# Patient Record
Sex: Male | Born: 1990 | Race: Black or African American | Hispanic: No | Marital: Single | Smoking: Former smoker
Health system: Southern US, Community
[De-identification: ages and names within clinical notes are randomized; demographics above are authoritative.]

## PROBLEM LIST (undated history)

## (undated) DIAGNOSIS — Z789 Other specified health status: Secondary | ICD-10-CM

## (undated) HISTORY — PX: NO PAST SURGERIES: SHX2092

---
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
## 2011-02-24 LAB — POCT I-STAT, CHEM 8
Calcium, Ion: 1.04 mmol/L — ABNORMAL LOW (ref 1.12–1.32)
Creatinine, Ser: 1.2 mg/dL (ref 0.4–1.5)
Glucose, Bld: 97 mg/dL (ref 70–99)
Potassium: 3.3 meq/L — ABNORMAL LOW (ref 3.5–5.1)
Sodium: 143 meq/L (ref 135–145)
TCO2: 23 mmol/L (ref 0–100)

## 2011-02-24 LAB — RAPID URINE DRUG SCREEN, HOSP PERFORMED
Amphetamines: NOT DETECTED
Opiates: NOT DETECTED

## 2011-02-24 LAB — CBC
Hemoglobin: 16.5 g/dL (ref 13.0–17.0)
MCH: 29.3 pg (ref 26.0–34.0)
RBC: 5.65 MIL/uL (ref 4.22–5.81)

## 2011-02-24 LAB — URINALYSIS, ROUTINE W REFLEX MICROSCOPIC
Glucose, UA: NEGATIVE mg/dL
Nitrite: NEGATIVE

## 2011-02-24 LAB — COMPREHENSIVE METABOLIC PANEL
ALT: 25 U/L (ref 0–53)
AST: 40 U/L — ABNORMAL HIGH (ref 0–37)
Albumin: 4.4 g/dL (ref 3.5–5.2)
Alkaline Phosphatase: 76 U/L (ref 39–117)
CO2: 25 mEq/L (ref 19–32)
Chloride: 109 mEq/L (ref 96–112)
Creatinine, Ser: 1.11 mg/dL (ref 0.4–1.5)
Glucose, Bld: 105 mg/dL — ABNORMAL HIGH (ref 70–99)
Sodium: 143 mEq/L (ref 135–145)
Total Protein: 7.3 g/dL (ref 6.0–8.3)

## 2011-02-24 LAB — SAMPLE TO BLOOD BANK

## 2011-02-24 LAB — URINE MICROSCOPIC-ADD ON

## 2011-02-24 LAB — PROTIME-INR: Prothrombin Time: 13.8 seconds (ref 11.6–15.2)

## 2011-02-24 LAB — LACTIC ACID, PLASMA: Lactic Acid, Venous: 2.6 mmol/L — ABNORMAL HIGH (ref 0.5–2.2)

## 2011-09-13 ENCOUNTER — Ambulatory Visit (INDEPENDENT_AMBULATORY_CARE_PROVIDER_SITE_OTHER): Payer: Self-pay

## 2011-09-13 ENCOUNTER — Inpatient Hospital Stay (INDEPENDENT_AMBULATORY_CARE_PROVIDER_SITE_OTHER)
Admission: RE | Admit: 2011-09-13 | Discharge: 2011-09-13 | Disposition: A | Discharge status: Home / Self Care | Payer: Self-pay | Source: Ambulatory Visit | Attending: Emergency Medicine | Admitting: Emergency Medicine

## 2011-09-13 DIAGNOSIS — R0789 Other chest pain: Secondary | ICD-10-CM

## 2012-02-15 ENCOUNTER — Encounter (HOSPITAL_COMMUNITY): Payer: Self-pay | Admitting: *Deleted

## 2012-02-15 ENCOUNTER — Inpatient Hospital Stay (HOSPITAL_COMMUNITY)
Admission: EM | Admit: 2012-02-15 | Discharge: 2012-02-24 | DRG: 515 | Disposition: A | Discharge status: Home Health | Payer: Medicaid Other | Attending: General Surgery | Admitting: General Surgery

## 2012-02-15 ENCOUNTER — Emergency Department (HOSPITAL_COMMUNITY): Payer: Medicaid Other

## 2012-02-15 DIAGNOSIS — IMO0002 Reserved for concepts with insufficient information to code with codable children: Secondary | ICD-10-CM

## 2012-02-15 DIAGNOSIS — S32509A Unspecified fracture of unspecified pubis, initial encounter for closed fracture: Principal | ICD-10-CM | POA: Diagnosis present

## 2012-02-15 DIAGNOSIS — D62 Acute posthemorrhagic anemia: Secondary | ICD-10-CM | POA: Diagnosis not present

## 2012-02-15 DIAGNOSIS — J9819 Other pulmonary collapse: Secondary | ICD-10-CM | POA: Diagnosis not present

## 2012-02-15 DIAGNOSIS — S8991XA Unspecified injury of right lower leg, initial encounter: Secondary | ICD-10-CM | POA: Diagnosis present

## 2012-02-15 DIAGNOSIS — S40019A Contusion of unspecified shoulder, initial encounter: Secondary | ICD-10-CM | POA: Diagnosis present

## 2012-02-15 DIAGNOSIS — S8000XA Contusion of unspecified knee, initial encounter: Secondary | ICD-10-CM | POA: Diagnosis present

## 2012-02-15 DIAGNOSIS — S01502A Unspecified open wound of oral cavity, initial encounter: Secondary | ICD-10-CM | POA: Diagnosis present

## 2012-02-15 DIAGNOSIS — F17200 Nicotine dependence, unspecified, uncomplicated: Secondary | ICD-10-CM | POA: Diagnosis present

## 2012-02-15 DIAGNOSIS — S0100XA Unspecified open wound of scalp, initial encounter: Secondary | ICD-10-CM | POA: Diagnosis present

## 2012-02-15 DIAGNOSIS — S060X9A Concussion with loss of consciousness of unspecified duration, initial encounter: Secondary | ICD-10-CM | POA: Diagnosis present

## 2012-02-15 DIAGNOSIS — S01512A Laceration without foreign body of oral cavity, initial encounter: Secondary | ICD-10-CM | POA: Diagnosis present

## 2012-02-15 DIAGNOSIS — Z79899 Other long term (current) drug therapy: Secondary | ICD-10-CM

## 2012-02-15 DIAGNOSIS — S83509A Sprain of unspecified cruciate ligament of unspecified knee, initial encounter: Secondary | ICD-10-CM | POA: Diagnosis present

## 2012-02-15 DIAGNOSIS — S8992XA Unspecified injury of left lower leg, initial encounter: Secondary | ICD-10-CM | POA: Diagnosis present

## 2012-02-15 DIAGNOSIS — J15211 Pneumonia due to Methicillin susceptible Staphylococcus aureus: Secondary | ICD-10-CM | POA: Diagnosis not present

## 2012-02-15 DIAGNOSIS — S60419A Abrasion of unspecified finger, initial encounter: Secondary | ICD-10-CM | POA: Diagnosis present

## 2012-02-15 DIAGNOSIS — S60511A Abrasion of right hand, initial encounter: Secondary | ICD-10-CM

## 2012-02-15 DIAGNOSIS — S0101XA Laceration without foreign body of scalp, initial encounter: Secondary | ICD-10-CM

## 2012-02-15 DIAGNOSIS — S0081XA Abrasion of other part of head, initial encounter: Secondary | ICD-10-CM | POA: Diagnosis present

## 2012-02-15 DIAGNOSIS — Y9210 Unspecified residential institution as the place of occurrence of the external cause: Secondary | ICD-10-CM | POA: Diagnosis not present

## 2012-02-15 DIAGNOSIS — R911 Solitary pulmonary nodule: Secondary | ICD-10-CM | POA: Diagnosis present

## 2012-02-15 DIAGNOSIS — T17908A Unspecified foreign body in respiratory tract, part unspecified causing other injury, initial encounter: Secondary | ICD-10-CM | POA: Diagnosis not present

## 2012-02-15 DIAGNOSIS — Y92410 Unspecified street and highway as the place of occurrence of the external cause: Secondary | ICD-10-CM

## 2012-02-15 DIAGNOSIS — S32599A Other specified fracture of unspecified pubis, initial encounter for closed fracture: Secondary | ICD-10-CM | POA: Diagnosis present

## 2012-02-15 DIAGNOSIS — S022XXA Fracture of nasal bones, initial encounter for closed fracture: Secondary | ICD-10-CM

## 2012-02-15 DIAGNOSIS — S838X9A Sprain of other specified parts of unspecified knee, initial encounter: Secondary | ICD-10-CM | POA: Diagnosis present

## 2012-02-15 DIAGNOSIS — R918 Other nonspecific abnormal finding of lung field: Secondary | ICD-10-CM | POA: Diagnosis present

## 2012-02-15 DIAGNOSIS — S060X1A Concussion with loss of consciousness of 30 minutes or less, initial encounter: Secondary | ICD-10-CM | POA: Diagnosis present

## 2012-02-15 DIAGNOSIS — S0083XA Contusion of other part of head, initial encounter: Secondary | ICD-10-CM | POA: Diagnosis present

## 2012-02-15 DIAGNOSIS — F411 Generalized anxiety disorder: Secondary | ICD-10-CM | POA: Diagnosis not present

## 2012-02-15 DIAGNOSIS — S4992XA Unspecified injury of left shoulder and upper arm, initial encounter: Secondary | ICD-10-CM | POA: Diagnosis present

## 2012-02-15 DIAGNOSIS — S0003XA Contusion of scalp, initial encounter: Secondary | ICD-10-CM | POA: Diagnosis present

## 2012-02-15 DIAGNOSIS — J386 Stenosis of larynx: Secondary | ICD-10-CM | POA: Diagnosis not present

## 2012-02-15 DIAGNOSIS — S70212A Abrasion, left hip, initial encounter: Secondary | ICD-10-CM

## 2012-02-15 DIAGNOSIS — S329XXA Fracture of unspecified parts of lumbosacral spine and pelvis, initial encounter for closed fracture: Secondary | ICD-10-CM

## 2012-02-15 DIAGNOSIS — N501 Vascular disorders of male genital organs: Secondary | ICD-10-CM | POA: Diagnosis present

## 2012-02-15 DIAGNOSIS — S7000XA Contusion of unspecified hip, initial encounter: Secondary | ICD-10-CM | POA: Diagnosis present

## 2012-02-15 HISTORY — DX: Other specified health status: Z78.9

## 2012-02-15 LAB — CBC
Hemoglobin: 16.9 g/dL (ref 13.0–17.0)
Platelets: 232 10*3/uL (ref 150–400)
RBC: 5.54 MIL/uL (ref 4.22–5.81)
WBC: 20.5 10*3/uL — ABNORMAL HIGH (ref 4.0–10.5)

## 2012-02-15 LAB — PROTIME-INR
INR: 1.07 (ref 0.00–1.49)
Prothrombin Time: 14.1 seconds (ref 11.6–15.2)

## 2012-02-15 LAB — COMPREHENSIVE METABOLIC PANEL
AST: 112 U/L — ABNORMAL HIGH (ref 0–37)
BUN: 10 mg/dL (ref 6–23)
CO2: 21 mEq/L (ref 19–32)
Calcium: 9.2 mg/dL (ref 8.4–10.5)
Chloride: 104 mEq/L (ref 96–112)
Creatinine, Ser: 1.04 mg/dL (ref 0.50–1.35)
GFR calc Af Amer: >90 mL/min (ref >90)
GFR calc non Af Amer: >90 mL/min (ref >90)
Glucose, Bld: 105 mg/dL — ABNORMAL HIGH (ref 70–99)
Total Bilirubin: 0.4 mg/dL (ref 0.3–1.2)

## 2012-02-15 LAB — SAMPLE TO BLOOD BANK

## 2012-02-15 MED ORDER — TETANUS-DIPHTH-ACELL PERTUSSIS 5-2.5-18.5 LF-MCG/0.5 IM SUSP
0.5000 mL | Freq: Once | INTRAMUSCULAR | Status: AC
  Administered 2012-02-15: 0.5 mL via INTRAMUSCULAR
  Filled 2012-02-15: qty 0.5

## 2012-02-15 MED ORDER — FENTANYL CITRATE 0.05 MG/ML IJ SOLN
50.0000 ug | Freq: Once | INTRAMUSCULAR | Status: AC
  Administered 2012-02-15: 50 ug via INTRAVENOUS
  Filled 2012-02-15: qty 2

## 2012-02-15 MED ORDER — IOHEXOL 300 MG/ML  SOLN
100.0000 mL | Freq: Once | INTRAMUSCULAR | Status: AC | PRN
Start: 2012-02-15 — End: 2012-02-15
  Administered 2012-02-15: 100 mL via INTRAVENOUS

## 2012-02-15 MED ORDER — SODIUM CHLORIDE 0.9 % IV BOLUS (SEPSIS)
1000.0000 mL | Freq: Once | INTRAVENOUS | Status: AC
  Administered 2012-02-15: 1000 mL via INTRAVENOUS
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
## 2012-02-15 NOTE — ED Notes (Signed)
Pt returned to room.  CSI at bedside.  Will transfer pt to room 2 when complete.
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
## 2012-02-15 NOTE — ED Notes (Signed)
See trauma chart
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
## 2012-02-15 NOTE — ED Notes (Signed)
Pt resting.  No distress noted.  Non-labored respirations.

## 2012-02-15 NOTE — ED Notes (Signed)
Pt to CT scan.

## 2012-02-16 ENCOUNTER — Encounter (HOSPITAL_COMMUNITY): Admission: EM | Disposition: A | Discharge status: Home Health | Payer: Self-pay | Source: Home / Self Care

## 2012-02-16 ENCOUNTER — Encounter (HOSPITAL_COMMUNITY): Payer: Self-pay

## 2012-02-16 ENCOUNTER — Encounter (HOSPITAL_COMMUNITY): Payer: Self-pay | Admitting: *Deleted

## 2012-02-16 ENCOUNTER — Inpatient Hospital Stay (HOSPITAL_COMMUNITY): Payer: Medicaid Other

## 2012-02-16 ENCOUNTER — Observation Stay (HOSPITAL_COMMUNITY): Payer: Medicaid Other

## 2012-02-16 DIAGNOSIS — S32599A Other specified fracture of unspecified pubis, initial encounter for closed fracture: Secondary | ICD-10-CM | POA: Diagnosis present

## 2012-02-16 DIAGNOSIS — R918 Other nonspecific abnormal finding of lung field: Secondary | ICD-10-CM | POA: Diagnosis present

## 2012-02-16 DIAGNOSIS — S0003XA Contusion of scalp, initial encounter: Secondary | ICD-10-CM | POA: Diagnosis present

## 2012-02-16 DIAGNOSIS — S060X1A Concussion with loss of consciousness of 30 minutes or less, initial encounter: Secondary | ICD-10-CM | POA: Diagnosis present

## 2012-02-16 DIAGNOSIS — S022XXA Fracture of nasal bones, initial encounter for closed fracture: Secondary | ICD-10-CM | POA: Diagnosis present

## 2012-02-16 DIAGNOSIS — J386 Stenosis of larynx: Secondary | ICD-10-CM | POA: Diagnosis not present

## 2012-02-16 DIAGNOSIS — S0081XA Abrasion of other part of head, initial encounter: Secondary | ICD-10-CM | POA: Diagnosis present

## 2012-02-16 DIAGNOSIS — S4992XA Unspecified injury of left shoulder and upper arm, initial encounter: Secondary | ICD-10-CM | POA: Diagnosis present

## 2012-02-16 DIAGNOSIS — S01512A Laceration without foreign body of oral cavity, initial encounter: Secondary | ICD-10-CM | POA: Diagnosis present

## 2012-02-16 DIAGNOSIS — J9600 Acute respiratory failure, unspecified whether with hypoxia or hypercapnia: Secondary | ICD-10-CM

## 2012-02-16 DIAGNOSIS — J988 Other specified respiratory disorders: Secondary | ICD-10-CM

## 2012-02-16 DIAGNOSIS — S60419A Abrasion of unspecified finger, initial encounter: Secondary | ICD-10-CM | POA: Diagnosis present

## 2012-02-16 DIAGNOSIS — N501 Vascular disorders of male genital organs: Secondary | ICD-10-CM | POA: Diagnosis present

## 2012-02-16 DIAGNOSIS — S8992XA Unspecified injury of left lower leg, initial encounter: Secondary | ICD-10-CM | POA: Diagnosis present

## 2012-02-16 DIAGNOSIS — S8991XA Unspecified injury of right lower leg, initial encounter: Secondary | ICD-10-CM | POA: Diagnosis present

## 2012-02-16 LAB — URINALYSIS, MICROSCOPIC ONLY
Glucose, UA: NEGATIVE mg/dL
Ketones, ur: NEGATIVE mg/dL
Leukocytes, UA: NEGATIVE
Nitrite: NEGATIVE
pH: 6 (ref 5.0–8.0)

## 2012-02-16 LAB — CBC
HCT: 46.5 % (ref 39.0–52.0)
Hemoglobin: 15.4 g/dL (ref 13.0–17.0)
MCHC: 33.1 g/dL (ref 30.0–36.0)
MCV: 89.3 fL (ref 78.0–100.0)

## 2012-02-16 LAB — BASIC METABOLIC PANEL
BUN: 8 mg/dL (ref 6–23)
Chloride: 99 mEq/L (ref 96–112)
GFR calc non Af Amer: >90 mL/min (ref >90)
Glucose, Bld: 100 mg/dL — ABNORMAL HIGH (ref 70–99)
Potassium: 4 mEq/L (ref 3.5–5.1)

## 2012-02-16 LAB — MRSA PCR SCREENING: MRSA by PCR: NEGATIVE

## 2012-02-16 SURGERY — Surgical Case
Anesthesia: Monitor Anesthesia Care

## 2012-02-16 MED ORDER — SODIUM CHLORIDE 0.9 % IV SOLN
INTRAVENOUS | Status: DC
  Administered 2012-02-16 – 2012-02-17 (×5): via INTRAVENOUS
  Administered 2012-02-18: 100 mL/h via INTRAVENOUS
  Administered 2012-02-19: 03:00:00 via INTRAVENOUS
  Administered 2012-02-20 – 2012-02-21 (×2): 100 mL/h via INTRAVENOUS
  Administered 2012-02-21: 20 mL/h via INTRAVENOUS

## 2012-02-16 MED ORDER — ONDANSETRON HCL 4 MG PO TABS: 4.0000 mg | ORAL_TABLET | Freq: Four times a day (QID) | ORAL | Status: DC | PRN

## 2012-02-16 MED ORDER — LACTATED RINGERS IV SOLN
INTRAVENOUS | Status: DC | PRN
  Administered 2012-02-16: 12:00:00 via INTRAVENOUS

## 2012-02-16 MED ORDER — FENTANYL CITRATE 0.05 MG/ML IJ SOLN
INTRAMUSCULAR | Status: DC | PRN
  Administered 2012-02-16: 100 ug via INTRAVENOUS
  Administered 2012-02-16: 150 ug via INTRAVENOUS

## 2012-02-16 MED ORDER — PANTOPRAZOLE SODIUM 40 MG PO TBEC
40.0000 mg | DELAYED_RELEASE_TABLET | Freq: Every day | ORAL | Status: DC
  Administered 2012-02-21: 40 mg via ORAL
  Filled 2012-02-16: qty 1

## 2012-02-16 MED ORDER — MIDAZOLAM HCL 2 MG/2ML IJ SOLN
2.0000 mg | Freq: Once | INTRAMUSCULAR | Status: DC
  Filled 2012-02-16 (×3): qty 2

## 2012-02-16 MED ORDER — FENTANYL CITRATE 0.05 MG/ML IJ SOLN
50.0000 ug | INTRAMUSCULAR | Status: DC | PRN
  Administered 2012-02-16: 100 ug via INTRAVENOUS
  Administered 2012-02-16 (×2): 50 ug via INTRAVENOUS
  Administered 2012-02-17 – 2012-02-18 (×9): 100 ug via INTRAVENOUS
  Filled 2012-02-16 (×11): qty 2

## 2012-02-16 MED ORDER — PROPOFOL 10 MG/ML IV EMUL
INTRAVENOUS | Status: DC | PRN
  Administered 2012-02-16: 100 ug/kg/min via INTRAVENOUS
  Administered 2012-02-16: 1000 mg

## 2012-02-16 MED ORDER — CHLORHEXIDINE GLUCONATE 0.12 % MT SOLN
15.0000 mL | Freq: Two times a day (BID) | OROMUCOSAL | Status: DC
  Administered 2012-02-16 – 2012-02-24 (×15): 15 mL via OROMUCOSAL
  Filled 2012-02-16 (×16): qty 15

## 2012-02-16 MED ORDER — PANTOPRAZOLE SODIUM 40 MG IV SOLR
40.0000 mg | Freq: Every day | INTRAVENOUS | Status: DC
  Administered 2012-02-16 – 2012-02-20 (×5): 40 mg via INTRAVENOUS
  Filled 2012-02-16 (×7): qty 40

## 2012-02-16 MED ORDER — PROPOFOL 10 MG/ML IV EMUL
INTRAVENOUS | Status: DC | PRN
  Administered 2012-02-16: 100 mg via INTRAVENOUS

## 2012-02-16 MED ORDER — CEFAZOLIN SODIUM 1-5 GM-% IV SOLN
INTRAVENOUS | Status: AC
  Filled 2012-02-16: qty 100

## 2012-02-16 MED ORDER — BIOTENE DRY MOUTH MT LIQD: 15.0000 mL | OROMUCOSAL | Status: DC | PRN

## 2012-02-16 MED ORDER — ONDANSETRON HCL 4 MG/2ML IJ SOLN: 4.0000 mg | Freq: Four times a day (QID) | INTRAMUSCULAR | Status: DC | PRN

## 2012-02-16 MED ORDER — BISACODYL 10 MG RE SUPP: 10.0000 mg | Freq: Every day | RECTAL | Status: DC | PRN

## 2012-02-16 MED ORDER — PROPOFOL 10 MG/ML IV EMUL
5.0000 ug/kg/min | INTRAVENOUS | Status: DC
  Administered 2012-02-16: 65.295 ug/kg/min via INTRAVENOUS
  Administered 2012-02-16: 40 ug/kg/min via INTRAVENOUS
  Administered 2012-02-16: 1000 mg via INTRAVENOUS
  Administered 2012-02-16: 48.972 ug/kg/min via INTRAVENOUS
  Administered 2012-02-17 – 2012-02-18 (×13): 70 ug/kg/min via INTRAVENOUS
  Administered 2012-02-18: 60 ug/kg/min via INTRAVENOUS
  Administered 2012-02-18 (×3): 70 ug/kg/min via INTRAVENOUS
  Administered 2012-02-18: 50 ug/kg/min via INTRAVENOUS
  Filled 2012-02-16 (×19): qty 100

## 2012-02-16 MED ORDER — PANTOPRAZOLE SODIUM 40 MG IV SOLR
40.0000 mg | Freq: Every day | INTRAVENOUS | Status: DC
  Filled 2012-02-16: qty 40

## 2012-02-16 MED ORDER — MIDAZOLAM HCL 5 MG/5ML IJ SOLN
INTRAMUSCULAR | Status: DC | PRN
  Administered 2012-02-16: 1 mg via INTRAVENOUS
  Administered 2012-02-16: 2 mg via INTRAVENOUS
  Administered 2012-02-16: 1 mg via INTRAVENOUS

## 2012-02-16 MED ORDER — PANTOPRAZOLE SODIUM 40 MG PO TBEC: 40.0000 mg | DELAYED_RELEASE_TABLET | Freq: Every day | ORAL | Status: DC

## 2012-02-16 MED ORDER — MIDAZOLAM HCL 2 MG/2ML IJ SOLN
2.0000 mg | INTRAMUSCULAR | Status: DC | PRN
  Administered 2012-02-16 – 2012-02-18 (×7): 2 mg via INTRAVENOUS
  Filled 2012-02-16 (×4): qty 2

## 2012-02-16 MED ORDER — PROPOFOL 10 MG/ML IV EMUL
INTRAVENOUS | Status: AC
  Administered 2012-02-16: 1000 mg via INTRAVENOUS
  Filled 2012-02-16: qty 100

## 2012-02-16 MED ORDER — SODIUM CHLORIDE 0.9 % IV SOLN
200.0000 ug | INTRAVENOUS | Status: DC | PRN
  Administered 2012-02-16: 8 ug via INTRAVENOUS

## 2012-02-16 MED ORDER — DEXAMETHASONE SODIUM PHOSPHATE 4 MG/ML IJ SOLN
8.0000 mg | INTRAMUSCULAR | Status: DC
  Administered 2012-02-16 – 2012-02-21 (×5): 8 mg via INTRAVENOUS
  Filled 2012-02-16 (×8): qty 2

## 2012-02-16 MED ORDER — HYDROCODONE-ACETAMINOPHEN 7.5-500 MG/15ML PO SOLN: 15.0000 mL | ORAL | Status: DC | PRN

## 2012-02-16 MED ORDER — DOCUSATE SODIUM 100 MG PO CAPS
100.0000 mg | ORAL_CAPSULE | Freq: Two times a day (BID) | ORAL | Status: DC
  Administered 2012-02-16: 100 mg via ORAL

## 2012-02-16 MED ORDER — MORPHINE SULFATE 4 MG/ML IJ SOLN
6.0000 mg | Freq: Once | INTRAMUSCULAR | Status: AC
  Administered 2012-02-16: 6 mg via INTRAVENOUS
  Filled 2012-02-16: qty 2

## 2012-02-16 MED ORDER — HYDROMORPHONE HCL PF 1 MG/ML IJ SOLN
1.0000 mg | INTRAMUSCULAR | Status: DC | PRN
  Administered 2012-02-16: 1 mg via INTRAVENOUS
  Administered 2012-02-16: 2 mg via INTRAVENOUS
  Administered 2012-02-16 (×2): 1 mg via INTRAVENOUS
  Filled 2012-02-16 (×2): qty 1
  Filled 2012-02-16: qty 2
  Filled 2012-02-16: qty 1

## 2012-02-16 MED ORDER — HYDROMORPHONE HCL PF 1 MG/ML IJ SOLN
0.5000 mg | INTRAMUSCULAR | Status: DC | PRN
  Administered 2012-02-16: 1 mg via INTRAVENOUS
  Filled 2012-02-16: qty 1

## 2012-02-16 MED ORDER — SODIUM CHLORIDE 0.9 % IV SOLN
0.4000 ug/kg/h | INTRAVENOUS | Status: DC
  Filled 2012-02-16 (×2): qty 2

## 2012-02-16 NOTE — ED Notes (Signed)
Family remains at bedside.  No distress noted.  Pt ready to be sutured.
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
## 2012-02-16 NOTE — Anesthesia Procedure Notes (Signed)
Procedures: Intubation Note  Topical anesthesia to airway with 4% lidocaine soaked cotton balls. Bilateral glossopharyngeal and superior laryngeal nerve blocks. 3 cc 1% lidocaine transtracheal block. DLx1 with glide scope patient intubated with 8.0 ETT without difficulty. 50 micrograms of precedex and 1 mg versed administered prior to intubation.  Matthew Brood, MD

## 2012-02-16 NOTE — Transfer of Care (Signed)
Immediate Anesthesia Transfer of Care Note  Patient: Matthew Baker  Procedure(s) Performed: * No procedures listed *  Patient Location: SICU  Anesthesia Type: General  Level of Consciousness: sedated  Airway & Oxygen Therapy: Patient remains intubated per anesthesia plan and Patient placed on Ventilator (see vital sign flow sheet for setting)  Post-op Assessment: Post -op Vital signs reviewed and stable  Post vital signs: Reviewed and stable  Complications: No apparent anesthesia complications

## 2012-02-16 NOTE — ED Provider Notes (Signed)
I saw and evaluated the patient, reviewed the resident's note and I agree with the findings and plan.  CRITICAL CARE Performed by: Dione Booze   Total critical care time: 90 minutes  Critical care time was exclusive of separately billable procedures and treating other patients.  Critical care was necessary to treat or prevent imminent or life-threatening deterioration.  Critical care was time spent personally by me on the following activities: development of treatment plan with patient and/or surrogate as well as nursing, discussions with consultants, evaluation of patient's response to treatment, examination of patient, obtaining history from patient or surrogate, ordering and performing treatments and interventions, ordering and review of laboratory studies, ordering and review of radiographic studies, pulse oximetry and re-evaluation of patient's condition.   Dione Booze, MD 02/16/12 [phone]
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
## 2012-02-16 NOTE — ED Notes (Signed)
No distress noted.  Pt being sutured by EDP.  Report called to 5000, will wait for ENT to arrive prior to taking pt up to 5000.
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
## 2012-02-16 NOTE — Op Note (Signed)
To OR and underwent intubation by anesthesia
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
## 2012-02-16 NOTE — ED Notes (Signed)
Family at bedside.  Updated pt on POC.  No distress noted.
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
## 2012-02-16 NOTE — Anesthesia Preprocedure Evaluation (Signed)
Anesthesia Evaluation  Patient identified by MRN, date of birth, ID band Patient awake    Reviewed: Allergy & Precautions, H&P , NPO status , Patient's Chart, lab work & pertinent test results  Airway Mallampati: IV TM Distance: >3 FB   Mouth opening: Limited Mouth Opening  Dental  (+) Teeth Intact   Pulmonary  breath sounds clear to auscultation        Cardiovascular Rhythm:Regular Rate:Normal     Neuro/Psych    GI/Hepatic   Endo/Other    Renal/GU      Musculoskeletal   Abdominal   Peds  Hematology   Anesthesia Other Findings Severe tongue edema, unable to speak.  Reproductive/Obstetrics                           Anesthesia Physical Anesthesia Plan  ASA: IV and Emergent  Anesthesia Plan: MAC   Post-op Pain Management:    Induction:   Airway Management Planned: Oral ETT  Additional Equipment:   Intra-op Plan:   Post-operative Plan: Post-operative intubation/ventilation  Informed Consent:   Plan Discussed with:   Anesthesia Plan Comments: (Patient with severe tongue edema needs elective intubation in OR. Plan topical anesthesia to airway, intubation with glide scope.  Kipp Brood, MD)        Anesthesia Quick Evaluation
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
## 2012-02-17 ENCOUNTER — Inpatient Hospital Stay (HOSPITAL_COMMUNITY): Payer: Medicaid Other

## 2012-02-17 LAB — BASIC METABOLIC PANEL
BUN: 11 mg/dL (ref 6–23)
Chloride: 104 mEq/L (ref 96–112)
Creatinine, Ser: 1 mg/dL (ref 0.50–1.35)
GFR calc non Af Amer: >90 mL/min (ref >90)
Glucose, Bld: 125 mg/dL — ABNORMAL HIGH (ref 70–99)
Potassium: 4.5 mEq/L (ref 3.5–5.1)

## 2012-02-17 LAB — CBC
HCT: 44 % (ref 39.0–52.0)
Hemoglobin: 14.2 g/dL (ref 13.0–17.0)
MCHC: 32.3 g/dL (ref 30.0–36.0)
MCV: 88.9 fL (ref 78.0–100.0)

## 2012-02-17 MED ORDER — CEFAZOLIN SODIUM-DEXTROSE 2-3 GM-% IV SOLR
2.0000 g | INTRAVENOUS | Status: AC
  Administered 2012-02-18: 2 g via INTRAVENOUS
  Filled 2012-02-17: qty 50

## 2012-02-17 MED ORDER — BACITRACIN ZINC 500 UNIT/GM EX OINT
TOPICAL_OINTMENT | CUTANEOUS | Status: DC | PRN
  Administered 2012-02-17: 06:00:00 via TOPICAL
  Filled 2012-02-17: qty 15

## 2012-02-17 MED ORDER — CEFAZOLIN SODIUM 1-5 GM-% IV SOLN: 1.0000 g | INTRAVENOUS | Status: DC
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
## 2012-02-17 NOTE — Progress Notes (Signed)
Follow up - Trauma and Critical Care  Patient Details:    Bookert Guzzi is an 21 y.o. male.  Lines/tubes : Airway 8 mm (Active)  Secured at (cm) 24 cm 02/17/2012  7:39 AM  Measured From Lips 02/17/2012  7:39 AM  Secured Location Right 02/17/2012  7:39 AM  Secured By [employer] 02/17/2012  7:39 AM  Cuff Pressure (cm H2O) 26 cm H2O 02/17/2012  7:39 AM     Urethral Catheter 16 Fr. (Active)  Site Assessment Clean;Intact 02/17/2012  8:00 AM  Collection Container Standard drainage bag 02/17/2012  8:00 AM  Securement Method Leg strap 02/17/2012  8:00 AM  Urinary Catheter Interventions Unclamped 02/17/2012  8:00 AM  Indication for Insertion or Continuance of Catheter Urinary output monitoring 02/17/2012  8:00 AM    Microbiology/Sepsis markers: Results for orders placed during the hospital encounter of 02/15/12  MRSA PCR SCREENING     Status: Normal   Collection Time   02/16/12  1:41 PM      Component Value Range Status Comment   MRSA by PCR NEGATIVE  NEGATIVE  Final     Anti-infectives:  Anti-infectives    None      Best Practice/Protocols:  VTE Prophylaxis: Mechanical GI Prophylaxis: Proton Pump Inhibitor Continous Sedation  Consults: Treatment Team:  Budd Palmer, MD    Events:  Subjective:    Overnight Issues: Continuous secretions.  Patient does not have subglottic ETT in place.  Objective:  Vital signs for last 24 hours: Temp:  [98.5 F (36.9 C)-99.8 F (37.7 C)] 99.2 F (37.3 C) (03/11 0748) Pulse Rate:  [67-94] 88  (03/11 0739) Resp:  [16-20] 16  (03/11 0800) BP: (97-158)/(44-95) 149/74 mmHg (03/11 0800) SpO2:  [99 %-100 %] 100 % (03/11 0800) FiO2 (%):  [29.8 %-50.7 %] 30.1 % (03/11 0800) Weight:  [97.1 kg (214 lb 1.1 oz)-102.059 kg (225 lb)] 97.1 kg (214 lb 1.1 oz) (03/11 0500)  Hemodynamic parameters for last 24 hours:    Intake/Output from previous day: 03/10 0701 - 03/11 0700 In: 3349.8 [I.V.:3349.8] Out: 5450  [Urine:5450]  Intake/Output this shift: Total I/O In: 142.9 [I.V.:142.9] Out: 30 [Urine:30]  Vent settings for last 24 hours: Vent Mode:  [-] PRVC FiO2 (%):  [29.8 %-50.7 %] 30.1 % Set Rate:  [16 bmp] 16 bmp Vt Set:  [700 mL] 700 mL PEEP:  [5 cmH20-5.1 cmH20] 5 cmH20 Plateau Pressure:  [15 cmH20-20 cmH20] 18 cmH20  Physical Exam:  General: no respiratory distress Neuro: alert, oriented, nonfocal exam and RASS -2 Resp: clear to auscultation bilaterally CVS: regular rate and rhythm, S1, S2 normal, no murmur, click, rub or gallop GI: soft, nontender, BS WNL, no r/g and hypoactive BS  Results for orders placed during the hospital encounter of 02/15/12 (from the past 24 hour(s))  CBC     Status: Abnormal   Collection Time   02/16/12 10:30 AM      Component Value Range   WBC 17.8 (*) 4.0 - 10.5 (K/uL)   RBC 5.21  4.22 - 5.81 (MIL/uL)   Hemoglobin 15.4  13.0 - 17.0 (g/dL)   HCT 16.1  09.6 - 04.5 (%)   MCV 89.3  78.0 - 100.0 (fL)   MCH 29.6  26.0 - 34.0 (pg)   MCHC 33.1  30.0 - 36.0 (g/dL)   RDW 40.9  81.1 - 91.4 (%)   Platelets 260  150 - 400 (K/uL)  BASIC METABOLIC PANEL     Status: Abnormal   Collection  Time   02/16/12 10:30 AM      Component Value Range   Sodium 137  135 - 145 (mEq/L)   Potassium 4.0  3.5 - 5.1 (mEq/L)   Chloride 99  96 - 112 (mEq/L)   CO2 27  19 - 32 (mEq/L)   Glucose, Bld 100 (*) 70 - 99 (mg/dL)   BUN 8  6 - 23 (mg/dL)   Creatinine, Ser 1.61  0.50 - 1.35 (mg/dL)   Calcium 9.3  8.4 - 09.6 (mg/dL)   GFR calc non Af Amer >90  >90 (mL/min)   GFR calc Af Amer >90  >90 (mL/min)  ETHANOL     Status: Normal   Collection Time   02/16/12 10:31 AM      Component Value Range   Alcohol, Ethyl (B) <11  0 - 11 (mg/dL)  MRSA PCR SCREENING     Status: Normal   Collection Time   02/16/12  1:41 PM      Component Value Range   MRSA by PCR NEGATIVE  NEGATIVE   CBC     Status: Abnormal   Collection Time   02/17/12  4:35 AM      Component Value Range   WBC 15.1 (*)  4.0 - 10.5 (K/uL)   RBC 4.95  4.22 - 5.81 (MIL/uL)   Hemoglobin 14.2  13.0 - 17.0 (g/dL)   HCT 04.5  40.9 - 81.1 (%)   MCV 88.9  78.0 - 100.0 (fL)   MCH 28.7  26.0 - 34.0 (pg)   MCHC 32.3  30.0 - 36.0 (g/dL)   RDW 91.4  78.2 - 95.6 (%)   Platelets 223  150 - 400 (K/uL)  BASIC METABOLIC PANEL     Status: Abnormal   Collection Time   02/17/12  4:35 AM      Component Value Range   Sodium 138  135 - 145 (mEq/L)   Potassium 4.5  3.5 - 5.1 (mEq/L)   Chloride 104  96 - 112 (mEq/L)   CO2 24  19 - 32 (mEq/L)   Glucose, Bld 125 (*) 70 - 99 (mg/dL)   BUN 11  6 - 23 (mg/dL)   Creatinine, Ser 2.13  0.50 - 1.35 (mg/dL)   Calcium 9.1  8.4 - 08.6 (mg/dL)   GFR calc non Af Amer >90  >90 (mL/min)   GFR calc Af Amer >90  >90 (mL/min)     Assessment/Plan:   NEURO  Altered Mental Status:  sedation   Plan: Continue sedation while ventilator dependent.  PULM  No acute pulmonary issues   Plan: CPM  CARDIO  No issues   Plan: CPM  RENAL  No issues   Plan: CPM  GI  Benign   Plan: CPM  ID  No active infection   Plan: CPM  HEME  No issues   Plan: CM  ENDO No acute issues   Plan: CPM  Global Issues  Airway obstruction secondary to swollen tongue. No subglottic ETT, will attempt to get extubated ASAP. No other issues    LOS: 2 days   Additional comments:I reviewed the patient's new clinical lab test results. CBC/Bmet and I reviewed the patients new imaging test results. CXR  Critical Care Total Time*: 30 Minutes  Will not start enteral nutrition at this time.  Hoping to get extubated soon then start PO intake.  Cleston Lautner III,Maebelle Sulton O 02/17/2012  *Care during the described time interval was provided by me and/or other providers on the critical care  team.  I have reviewed this patient's available data, including medical history, events of note, physical examination and test results as part of my evaluation.

## 2012-02-17 NOTE — Progress Notes (Signed)
UR of chart complete.  
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
## 2012-02-18 ENCOUNTER — Encounter (HOSPITAL_COMMUNITY): Payer: Self-pay | Admitting: *Deleted

## 2012-02-18 ENCOUNTER — Encounter (HOSPITAL_COMMUNITY): Payer: Self-pay | Admitting: Anesthesiology

## 2012-02-18 ENCOUNTER — Inpatient Hospital Stay (HOSPITAL_COMMUNITY): Payer: Medicaid Other | Admitting: Anesthesiology

## 2012-02-18 ENCOUNTER — Encounter (HOSPITAL_COMMUNITY): Admission: EM | Disposition: A | Discharge status: Home Health | Payer: Self-pay | Source: Home / Self Care

## 2012-02-18 ENCOUNTER — Inpatient Hospital Stay (HOSPITAL_COMMUNITY): Payer: Medicaid Other

## 2012-02-18 HISTORY — PX: ORIF PELVIC FRACTURE: SHX2128

## 2012-02-18 LAB — POCT I-STAT 4, (NA,K, GLUC, HGB,HCT)
HCT: 39 % (ref 39.0–52.0)
Hemoglobin: 13.3 g/dL (ref 13.0–17.0)

## 2012-02-18 LAB — CBC
HCT: 35.1 % — ABNORMAL LOW (ref 39.0–52.0)
Hemoglobin: 14.5 g/dL (ref 13.0–17.0)
Hemoglobin: 11.8 g/dL — ABNORMAL LOW (ref 13.0–17.0)
MCH: 29.9 pg (ref 26.0–34.0)
MCH: 29.4 pg (ref 26.0–34.0)
MCHC: 33.8 g/dL (ref 30.0–36.0)
MCV: 87.5 fL (ref 78.0–100.0)
RBC: 4.01 MIL/uL — ABNORMAL LOW (ref 4.22–5.81)

## 2012-02-18 LAB — BASIC METABOLIC PANEL
BUN: 16 mg/dL (ref 6–23)
Calcium: 9 mg/dL (ref 8.4–10.5)
GFR calc non Af Amer: >90 mL/min (ref >90)
Glucose, Bld: 107 mg/dL — ABNORMAL HIGH (ref 70–99)
Sodium: 137 mEq/L (ref 135–145)

## 2012-02-18 LAB — TYPE AND SCREEN: Antibody Screen: NEGATIVE

## 2012-02-18 SURGERY — OPEN REDUCTION INTERNAL FIXATION (ORIF) PELVIC FRACTURE
Anesthesia: General | Site: Pelvis | Laterality: Left | Wound class: Clean

## 2012-02-18 MED ORDER — SODIUM CHLORIDE 0.9 % IV SOLN
2.0000 mg/h | INTRAVENOUS | Status: DC
  Administered 2012-02-18: 2 mg/h via INTRAVENOUS
  Administered 2012-02-18: 5 mg/h via INTRAVENOUS
  Administered 2012-02-18: 2 mg/h via INTRAVENOUS
  Administered 2012-02-19: 9 mg/h via INTRAVENOUS
  Filled 2012-02-18 (×3): qty 10

## 2012-02-18 MED ORDER — SODIUM CHLORIDE 0.9 % IV SOLN
INTRAVENOUS | Status: DC | PRN
  Administered 2012-02-18: 13:00:00 via INTRAVENOUS

## 2012-02-18 MED ORDER — 0.9 % SODIUM CHLORIDE (POUR BTL) OPTIME
TOPICAL | Status: DC | PRN
  Administered 2012-02-18: 1000 mL

## 2012-02-18 MED ORDER — CEFAZOLIN SODIUM 1-5 GM-% IV SOLN
1.0000 g | Freq: Three times a day (TID) | INTRAVENOUS | Status: AC
  Administered 2012-02-18 – 2012-02-19 (×3): 1 g via INTRAVENOUS
  Filled 2012-02-18 (×3): qty 50

## 2012-02-18 MED ORDER — LACTATED RINGERS IV SOLN
INTRAVENOUS | Status: DC | PRN
  Administered 2012-02-18 (×2): via INTRAVENOUS

## 2012-02-18 MED ORDER — VECURONIUM BROMIDE 10 MG IV SOLR
INTRAVENOUS | Status: DC | PRN
  Administered 2012-02-18 (×2): 10 mg via INTRAVENOUS

## 2012-02-18 MED ORDER — FENTANYL BOLUS VIA INFUSION
50.0000 ug | Freq: Four times a day (QID) | INTRAVENOUS | Status: DC | PRN
  Filled 2012-02-18: qty 100

## 2012-02-18 MED ORDER — SODIUM CHLORIDE 0.9 % IV SOLN
50.0000 ug/h | INTRAVENOUS | Status: DC
  Administered 2012-02-18: 50 ug/h via INTRAVENOUS
  Administered 2012-02-18: 100 ug/h via INTRAVENOUS
  Administered 2012-02-19: 250 ug/h via INTRAVENOUS
  Filled 2012-02-18 (×2): qty 50

## 2012-02-18 MED ORDER — DEXTROSE 5 % IV SOLN
INTRAVENOUS | Status: DC | PRN
  Administered 2012-02-18: 14:00:00 via INTRAVENOUS

## 2012-02-18 MED ORDER — MIDAZOLAM BOLUS VIA INFUSION
1.0000 mg | INTRAVENOUS | Status: DC | PRN
  Filled 2012-02-18: qty 2

## 2012-02-18 MED ORDER — FENTANYL CITRATE 0.05 MG/ML IJ SOLN
INTRAMUSCULAR | Status: DC | PRN
  Administered 2012-02-18 (×3): 100 ug via INTRAVENOUS
  Administered 2012-02-18: 150 ug via INTRAVENOUS

## 2012-02-18 MED ORDER — ROCURONIUM BROMIDE 100 MG/10ML IV SOLN
INTRAVENOUS | Status: DC | PRN
  Administered 2012-02-18: 50 mg via INTRAVENOUS

## 2012-02-18 SURGICAL SUPPLY — 59 items
BIT DRILL AO MATTA 2.5MX230M (BIT) IMPLANT
BIT DRILL CANN LRG QC 5X300 (BIT) ×1 IMPLANT
BIT DRILL TWST MATTA 3.5MX195M (BIT) IMPLANT
BLADE SURG ROTATE 9660 (MISCELLANEOUS) ×1 IMPLANT
BRUSH SCRUB DISP (MISCELLANEOUS) ×4 IMPLANT
CLOTH BEACON ORANGE TIMEOUT ST (SAFETY) ×2 IMPLANT
DRAIN CHANNEL 15F RND FF W/TCR (WOUND CARE) ×2 IMPLANT
DRAPE C-ARM 42X72 X-RAY (DRAPES) ×2 IMPLANT
DRAPE C-ARMOR (DRAPES) ×2 IMPLANT
DRAPE INCISE IOBAN 66X45 STRL (DRAPES) ×4 IMPLANT
DRAPE ORTHO SPLIT 77X108 STRL (DRAPES) ×4
DRAPE SURG ORHT 6 SPLT 77X108 (DRAPES) ×2 IMPLANT
DRAPE U-SHAPE 47X51 STRL (DRAPES) ×2 IMPLANT
DRILL BIT AO MATTA 2.5MX230M (BIT) ×2
DRILL TWIST AO MATTA 3.5MX195M (BIT) ×2
DRSG ADAPTIC 3X8 NADH LF (GAUZE/BANDAGES/DRESSINGS) ×2 IMPLANT
DRSG PAD ABDOMINAL 8X10 ST (GAUZE/BANDAGES/DRESSINGS) ×4 IMPLANT
ELECT REM PT RETURN 9FT ADLT (ELECTROSURGICAL) ×2
ELECTRODE REM PT RTRN 9FT ADLT (ELECTROSURGICAL) ×1 IMPLANT
EVACUATOR SILICONE 100CC (DRAIN) ×2 IMPLANT
GLOVE BIO SURGEON STRL SZ7.5 (GLOVE) ×2 IMPLANT
GLOVE BIO SURGEON STRL SZ8 (GLOVE) ×2 IMPLANT
GLOVE BIOGEL PI IND STRL 7.5 (GLOVE) ×1 IMPLANT
GLOVE BIOGEL PI IND STRL 8 (GLOVE) ×1 IMPLANT
GLOVE BIOGEL PI INDICATOR 7.5 (GLOVE) ×1
GLOVE BIOGEL PI INDICATOR 8 (GLOVE) ×1
GOWN PREVENTION PLUS XLARGE (GOWN DISPOSABLE) ×2 IMPLANT
GOWN STRL NON-REIN LRG LVL3 (GOWN DISPOSABLE) ×4 IMPLANT
GUIDEWIRE THREADED 2.8 (WIRE) ×1 IMPLANT
KIT BASIN OR (CUSTOM PROCEDURE TRAY) ×2 IMPLANT
KIT ROOM TURNOVER OR (KITS) ×2 IMPLANT
MANIFOLD NEPTUNE II (INSTRUMENTS) ×2 IMPLANT
NS IRRIG 1000ML POUR BTL (IV SOLUTION) ×4 IMPLANT
PACK TOTAL JOINT (CUSTOM PROCEDURE TRAY) ×2 IMPLANT
PAD ARMBOARD 7.5X6 YLW CONV (MISCELLANEOUS) ×4 IMPLANT
PLATE SYMPHYSIS 92.5M 6H (Plate) ×1 IMPLANT
SCREW CANN 7.3X100 32MM THRD (Screw) ×1 IMPLANT
SCREW CORTEX ST MATTA 3.5X22MM (Screw) ×1 IMPLANT
SCREW CORTEX ST MATTA 3.5X28MM (Screw) ×1 IMPLANT
SCREW CORTEX ST MATTA 3.5X30MM (Screw) ×1 IMPLANT
SCREW CORTEX ST MATTA 3.5X34MM (Screw) ×1 IMPLANT
SCREW CORTEX ST MATTA 3.5X38M (Screw) ×1 IMPLANT
SCREW CORTEX ST MATTA 3.5X40MM (Screw) ×2 IMPLANT
SCREW CORTEX ST MATTA 3.5X45MM (Screw) ×1 IMPLANT
SPONGE GAUZE 4X4 12PLY (GAUZE/BANDAGES/DRESSINGS) ×4 IMPLANT
SPONGE LAP 18X18 X RAY DECT (DISPOSABLE) IMPLANT
STAPLER VISISTAT 35W (STAPLE) ×2 IMPLANT
SUCTION FRAZIER TIP 10 FR DISP (SUCTIONS) ×2 IMPLANT
SUT VIC AB 0 CT1 27 (SUTURE) ×2
SUT VIC AB 0 CT1 27XBRD ANBCTR (SUTURE) ×4 IMPLANT
SUT VIC AB 1 CT1 18XCR BRD 8 (SUTURE) ×2 IMPLANT
SUT VIC AB 1 CT1 8-18 (SUTURE) ×2
SUT VIC AB 2-0 CT1 27 (SUTURE) ×4
SUT VIC AB 2-0 CT1 TAPERPNT 27 (SUTURE) ×4 IMPLANT
TOWEL OR 17X24 6PK STRL BLUE (TOWEL DISPOSABLE) ×2 IMPLANT
TOWEL OR 17X26 10 PK STRL BLUE (TOWEL DISPOSABLE) ×4 IMPLANT
TRAY FOLEY CATH 14FR (SET/KITS/TRAYS/PACK) IMPLANT
WASHER OIC 13MM 6 PACK (Screw) ×1 IMPLANT
WATER STERILE IRR 1000ML POUR (IV SOLUTION) ×8 IMPLANT

## 2012-02-18 NOTE — Transfer of Care (Signed)
Immediate Anesthesia Transfer of Care Note  Patient: Matthew Baker  Procedure(s) Performed: Procedure(s) (LRB): OPEN REDUCTION INTERNAL FIXATION (ORIF) PELVIC FRACTURE (Left)  Patient Location: PACU and SICU  Anesthesia Type: General  Level of Consciousness: sedated  Airway & Oxygen Therapy: Patient placed on Ventilator (see vital sign flow sheet for setting)  Post-op Assessment: Report given to PACU RN and Post -op Vital signs reviewed and stable  Post vital signs: Reviewed and stable  Complications: No apparent anesthesia complications
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
## 2012-02-18 NOTE — Preoperative (Signed)
Beta Blockers   Reason not to administer Beta Blockers:Not Applicable 

## 2012-02-18 NOTE — Consult Note (Signed)
I have seen and examined the patient. I agree with the findings above. Discussed with the patient's family were the risks and benefits of surgery, including the possibility of infection, nerve injury, vessel injury, wound breakdown, arthritis, symptomatic hardware, DVT/ PE, loss of motion, bladder injury, and need for further surgery among others.  His mother understood these risks and wished to proceed.  Budd Palmer, MD 02/18/2012 3:42 PM
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
## 2012-02-18 NOTE — Brief Op Note (Signed)
02/15/2012 - 02/18/2012  3:45 PM  PATIENT:  Matthew Baker  21 y.o. male  PRE-OPERATIVE DIAGNOSIS:  Pelvic Ring Fracture Left Side  POST-OPERATIVE DIAGNOSIS:  pelfic ring fracture left side  PROCEDURE:  Procedure(s) (LRB): OPEN REDUCTION INTERNAL FIXATION (ORIF) PELVIC FRACTURE (Left)  SURGEON:  Surgeon(s) and Role:    * Budd Palmer, MD - Primary  PHYSICIAN ASSISTANT: Montez Morita, [facility]  ANESTHESIA:   none  EBL:  Total I/O In: 1974.5 [I.V.:1964.5; IV Piggyback:10] Out: 685 [Urine:485; Other:150; Blood:50]  BLOOD ADMINISTERED:none  DRAINS: none   LOCAL MEDICATIONS USED:  NONE  SPECIMEN:  No Specimen  DISPOSITION OF SPECIMEN:  N/A  COUNTS:  YES  TOURNIQUET:  * No tourniquets in log *  DICTATION: .Other Dictation: Dictation Number [id number]  PLAN OF CARE: Admit to inpatient   PATIENT DISPOSITION:  PACU - hemodynamically stable.   Delay start of Pharmacological VTE agent (>24hrs) due to surgical blood loss or risk of bleeding: yes

## 2012-02-18 NOTE — Anesthesia Preprocedure Evaluation (Addendum)
Anesthesia Evaluation  Patient identified by MRN, date of birth, ID band Patient unresponsive    Reviewed: Allergy & Precautions, H&P , Patient's Chart, lab work & pertinent test results  Airway Mallampati: II TM Distance: >3 FB     Dental  (+) Teeth Intact   Pulmonary Current Smoker,  + rhonchi         Cardiovascular negative cardio ROS  Rhythm:Regular Rate:Tachycardia     Neuro/Psych negative neurological ROS     GI/Hepatic negative GI ROS, Neg liver ROS,   Endo/Other  negative endocrine ROS  Renal/GU negative Renal ROS  negative genitourinary   Musculoskeletal negative musculoskeletal ROS (+)   Abdominal Normal abdominal exam  (+)   Peds  Hematology negative hematology ROS (+)   Anesthesia Other Findings   Reproductive/Obstetrics negative OB ROS                         Anesthesia Physical Anesthesia Plan  ASA: III  Anesthesia Plan: General   Post-op Pain Management:    Induction: Intravenous  Airway Management Planned: Oral ETT  Additional Equipment:   Intra-op Plan:   Post-operative Plan: Post-operative intubation/ventilation  Informed Consent: I have reviewed the patients History and Physical, chart, labs and discussed the procedure including the risks, benefits and alternatives for the proposed anesthesia with the patient or authorized representative who has indicated his/her understanding and acceptance.   History available from chart only  Plan Discussed with: CRNA and Anesthesiologist  Anesthesia Plan Comments:        Anesthesia Quick Evaluation

## 2012-02-18 NOTE — Progress Notes (Signed)
Wasted 15 cc propofol witnessed by [employer], rn.

## 2012-02-19 ENCOUNTER — Inpatient Hospital Stay (HOSPITAL_COMMUNITY): Payer: Medicaid Other

## 2012-02-19 LAB — BASIC METABOLIC PANEL
CO2: 25 mEq/L (ref 19–32)
GFR calc non Af Amer: >90 mL/min (ref >90)
Glucose, Bld: 87 mg/dL (ref 70–99)
Potassium: 3.1 mEq/L — ABNORMAL LOW (ref 3.5–5.1)
Sodium: 142 mEq/L (ref 135–145)

## 2012-02-19 LAB — CBC
Hemoglobin: 11 g/dL — ABNORMAL LOW (ref 13.0–17.0)
RBC: 3.76 MIL/uL — ABNORMAL LOW (ref 4.22–5.81)
WBC: 16.5 10*3/uL — ABNORMAL HIGH (ref 4.0–10.5)

## 2012-02-19 LAB — POCT I-STAT 3, ART BLOOD GAS (G3+)
O2 Saturation: 88 %
TCO2: 23 mmol/L (ref 0–100)
pCO2 arterial: 36.4 mmHg (ref 35.0–45.0)

## 2012-02-19 LAB — TRIGLYCERIDES: Triglycerides: 131 mg/dL (ref <150)

## 2012-02-19 MED ORDER — HYDROMORPHONE HCL PF 1 MG/ML IJ SOLN
0.5000 mg | INTRAMUSCULAR | Status: DC | PRN
  Administered 2012-02-19 (×3): 0.5 mg via INTRAVENOUS
  Administered 2012-02-20 – 2012-02-21 (×2): 1 mg via INTRAVENOUS
  Filled 2012-02-19 (×4): qty 1

## 2012-02-19 MED ORDER — VECURONIUM BROMIDE 10 MG IV SOLR
5.0000 mg | Freq: Once | INTRAVENOUS | Status: AC
  Administered 2012-02-19: 5 mg via INTRAVENOUS

## 2012-02-19 MED ORDER — HYDROMORPHONE HCL PF 1 MG/ML IJ SOLN
INTRAMUSCULAR | Status: AC
  Administered 2012-02-19: 0.5 mg via INTRAVENOUS
  Filled 2012-02-19: qty 1

## 2012-02-19 MED ORDER — MOXIFLOXACIN HCL IN NACL 400 MG/250ML IV SOLN
400.0000 mg | INTRAVENOUS | Status: DC
  Administered 2012-02-19 – 2012-02-21 (×3): 400 mg via INTRAVENOUS
  Filled 2012-02-19 (×4): qty 250

## 2012-02-19 MED ORDER — ENOXAPARIN SODIUM 30 MG/0.3ML ~~LOC~~ SOLN
30.0000 mg | Freq: Two times a day (BID) | SUBCUTANEOUS | Status: DC
  Administered 2012-02-19 – 2012-02-24 (×9): 30 mg via SUBCUTANEOUS
  Filled 2012-02-19 (×13): qty 0.3

## 2012-02-19 MED ORDER — HALOPERIDOL LACTATE 5 MG/ML IJ SOLN
5.0000 mg | Freq: Four times a day (QID) | INTRAMUSCULAR | Status: DC | PRN
  Administered 2012-02-20 (×2): 5 mg via INTRAVENOUS
  Filled 2012-02-19: qty 2
  Filled 2012-02-19 (×2): qty 1

## 2012-02-19 MED ORDER — VECURONIUM BROMIDE 10 MG IV SOLR
INTRAVENOUS | Status: AC
  Administered 2012-02-19: 5 mg via INTRAVENOUS
  Filled 2012-02-19: qty 10
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
## 2012-02-19 NOTE — Progress Notes (Signed)
Subjective: 1 Day Post-Op Procedure(s) (LRB): OPEN REDUCTION INTERNAL FIXATION (ORIF) PELVIC FRACTURE (Left) Intubated, sedated post bronch Minimally responsive to commands currently  Objective: Current Vitals Blood pressure 102/35, pulse 99, temperature 102.4 F (39.1 C), temperature source Axillary, resp. rate 16, height 6\' 4"  (1.93 m), weight 97.1 kg (214 lb 1.1 oz), SpO2 95.00%. Vital signs in last 24 hours: Temp:  [98.8 F (37.1 C)-102.4 F (39.1 C)] 102.4 F (39.1 C) (03/13 0821) Pulse Rate:  [73-121] 99  (03/13 0830) Resp:  [13-31] 16  (03/13 0830) BP: (94-152)/(35-79) 102/35 mmHg (03/13 0830) SpO2:  [93 %-100 %] 95 % (03/13 0830) FiO2 (%):  [29.9 %-100 %] 39.8 % (03/13 0830)  Intake/Output from previous day: 03/12 0701 - 03/13 0700 In: 3910.9 [I.V.:3800.9; IV Piggyback:110] Out: 1470 [Urine:1270; Blood:50]  LABS  Basename 02/19/12 0500 02/18/12 2116 02/18/12 1410 02/18/12 0330 02/17/12 0435  HGB 11.0* 11.8* 13.3 14.5 14.2    Basename 02/19/12 0500 02/18/12 2116  WBC 16.5* 21.0*  RBC 3.76* 4.01*  HCT 33.4* 35.1*  PLT 183 134*    Basename 02/19/12 0500 02/18/12 1410 02/18/12 0330  NA 142 143 --  K 3.1* 3.6 --  CL 108 -- 103  CO2 25 -- 22  BUN 14 -- 16  CREATININE 1.13 -- 0.92  GLUCOSE 87 80 --  CALCIUM 8.3* -- 9.0   No results found for this basename: LABPT:2,INR:2 in the last 72 hours  Incisions clean, intact, no drainage; unable to elicit distal motor volitionally but spontaneous toe flexion and extension noted  Imaging Dg Pelvis 1-2 Views  02/18/2012  *RADIOLOGY REPORT*  Clinical Data: Left SI screw and pelvic ring.  PELVIS - 1-2 VIEW  Comparison: 02/17/2012,CT of the abdomen and pelvis 02/15/2012  Findings: Screw plate fixation transfixes the symphysis pubis.  A screw traverses the left SI joint.  IMPRESSION: ORIF of pelvic fractures.  Original Report Authenticated By: Patterson Hammersmith, M.D.   Dg Pelvis 1-2 Views  02/17/2012  *RADIOLOGY REPORT*   Clinical Data: Pelvic ring fractures.  PELVIS - 1-2 VIEW (inlet and outlet views)  Comparison: Radiographs dated 02/17/2012 and CT scan dated 02/15/2012  Findings: The inlet and outlet views demonstrate slight malalignment at the symphysis pubis with small avulsions of bone from both sides of the symphysis pubis.  No other fractures.  IMPRESSION: No significant change in the appearance of the pelvis since the prior CT scan of 02/15/2012.  Slight malalignment and small bony avulsions of the symphysis pubis as seen on the prior CT scan.  The malalignment is not apparent on routine pelvis radiographs.  Original Report Authenticated By: Gwynn Burly, M.D.   Dg Pelvis 3v Judet  02/18/2012  *RADIOLOGY REPORT*  Clinical Data: Postop  JUDET PELVIS - 3+ VIEW  Comparison: 02/17/2012  Findings: Of a single screw transfixes the left SI joint.  Plates and screws transfix the pubic symphysis.  No breakage or loosening of the hardware.  Anatomic alignment of the pelvic ring.  IMPRESSION: ORIF open book injury of the pelvis.  Original Report Authenticated By: Donavan Burnet, M.D.   Dg Pelvis 3v Judet  02/17/2012  *RADIOLOGY REPORT*  Clinical Data: Pelvic ring fractures.  JUDET PELVIS - 3+ VIEW  Comparison: CT scan dated 02/15/2012  Findings: Tiny bony avulsions are seen adjacent to the right side of the symphysis pubis.  The alignment at the symphysis pubis is anatomic.  Symphysis pubis is minimally diastatic.  There is also slight diastasis of both sacroiliac joints.  IMPRESSION: Minimal diastasis of the symphysis pubis and SI joints.  Tiny avulsions at the symphysis pubis are stable.  Original Report Authenticated By: Gwynn Burly, M.D.   Dg Chest Port 1 View  02/19/2012  *RADIOLOGY REPORT*  Clinical Data: Check endotracheal tube placement.  Pelvic fracture.  PORTABLE CHEST - 1 VIEW  Comparison: Chest x-ray 02/17/2012.  Findings: Endotracheal tube tip is at the level of the thoracic inlet.  Compared to the prior  examination, there is a new area of consolidation in the right base obscuring the right hemidiaphragm, concerning for sequela of aspiration or developing right lower lobe pneumonia.  There may also be some right lower lobe atelectasis, as evidenced by mild elevation of the right hemidiaphragm (however, the minor fissure is not displaced significantly).  Left lung appears clear.  Pulmonary vasculature is normal.  Heart size is normal. The patient is rotated to the right on today's exam, resulting in distortion of the mediastinal contours and reduced diagnostic sensitivity and specificity for mediastinal pathology.  IMPRESSION: 1.  Endotracheal tube appears properly located. 2.  Interval development of a combination of airspace consolidation and atelectasis in the right lower lobe, concerning for sequela of aspiration.  Original Report Authenticated By: Florencia Reasons, M.D.    Assessment/Plan: 1 Day Post-Op Procedure(s) (LRB): OPEN REDUCTION INTERNAL FIXATION (ORIF) PELVIC FRACTURE (Left)  DVT prophy with Lovenox NWB LLE x 6-8 wks Re-examine post extubation PT/ OT

## 2012-02-19 NOTE — Procedures (Signed)
Bronchoscopy Procedure Note Matthew Baker [id number] 05/03/1991  Procedure: Bronchoscopy Indications: Diagnostic evaluation of the airways, Obtain specimens for culture and/or other diagnostic studies, Remove secretions and Pre-procedure CXR demonstrated RLL cosolidation.  Procedure Details Consent: Risks of procedure as well as the alternatives and risks of each were explained to the (patient/caregiver).  Consent for procedure obtained. Time Out: Verified patient identification, verified procedure, site/side was marked, verified correct patient position, special equipment/implants available, medications/allergies/relevent history reviewed, required imaging and test results available.  Performed  In preparation for procedure, patient was given 100% FiO2, bronchoscope lubricated and The patient was bolused with fentanyl, versed, and norcuron. Sedation: Benzodiazepines, Muscle relaxants and fentanyl  Airway entered and the following bronchi were examined: RUL, RML, RLL and Bronchi.   Procedures performed: Brushings performed Bronchoscope removed.    Evaluation Hemodynamic Status: BP stable throughout; O2 sats: stable throughout Patient's Current Condition: stable Specimens:  Sent purulent fluid Complications: No apparent complications Patient did tolerate procedure well.   Solita Macadam III,Kelven Flater O 02/19/2012

## 2012-02-19 NOTE — Procedures (Signed)
Extubation Procedure Note  Patient Details:   Name: Matthew Baker DOB: [birth year] MRN: [id number]   Airway Documentation:   pt extubated per dr wyatt order @1130  to 4L [place] pt tolerating well no distress at this time pt had pos leak no stridor noted at this time  Evaluation  O2 sats: stable throughout and currently acceptable Complications: No apparent complications Patient did tolerate procedure well. Bilateral Breath Sounds: Rhonchi Suctioning: Airway   Christie Beckers 02/19/2012, 11:38 AM

## 2012-02-20 DIAGNOSIS — R404 Transient alteration of awareness: Secondary | ICD-10-CM

## 2012-02-20 LAB — BASIC METABOLIC PANEL
CO2: 24 mEq/L (ref 19–32)
Calcium: 8.9 mg/dL (ref 8.4–10.5)
Creatinine, Ser: 0.94 mg/dL (ref 0.50–1.35)
Glucose, Bld: 119 mg/dL — ABNORMAL HIGH (ref 70–99)
Sodium: 142 mEq/L (ref 135–145)

## 2012-02-20 LAB — CBC
HCT: 33.8 % — ABNORMAL LOW (ref 39.0–52.0)
Hemoglobin: 11.2 g/dL — ABNORMAL LOW (ref 13.0–17.0)
MCV: 88.3 fL (ref 78.0–100.0)
RBC: 3.83 MIL/uL — ABNORMAL LOW (ref 4.22–5.81)
WBC: 23.2 10*3/uL — ABNORMAL HIGH (ref 4.0–10.5)

## 2012-02-20 LAB — DIFFERENTIAL
Eosinophils Relative: 0 % (ref 0–5)
Lymphocytes Relative: 6 % — ABNORMAL LOW (ref 12–46)
Lymphs Abs: 1.4 10*3/uL (ref 0.7–4.0)
Monocytes Absolute: 1.2 10*3/uL — ABNORMAL HIGH (ref 0.1–1.0)
Monocytes Relative: 5 % (ref 3–12)

## 2012-02-20 MED ORDER — SODIUM CHLORIDE 0.9 % IV SOLN
0.2000 ug/kg/h | INTRAVENOUS | Status: DC
  Administered 2012-02-20 (×2): 0.5 ug/kg/h via INTRAVENOUS
  Filled 2012-02-20: qty 2

## 2012-02-20 MED ORDER — FOLIC ACID 1 MG PO TABS
1.0000 mg | ORAL_TABLET | Freq: Every day | ORAL | Status: DC
  Administered 2012-02-20 – 2012-02-24 (×5): 1 mg via ORAL
  Filled 2012-02-20 (×5): qty 1

## 2012-02-20 MED ORDER — SODIUM CHLORIDE 0.9 % IV SOLN
0.2000 ug/kg/h | INTRAVENOUS | Status: DC
  Administered 2012-02-20: 0.2 ug/kg/h via INTRAVENOUS
  Administered 2012-02-20: 0.5 ug/kg/h via INTRAVENOUS
  Filled 2012-02-20 (×3): qty 4

## 2012-02-20 MED ORDER — PROPOFOL 10 MG/ML IV EMUL
INTRAVENOUS | Status: AC
  Filled 2012-02-20: qty 100

## 2012-02-20 MED ORDER — ADULT MULTIVITAMIN W/MINERALS CH
1.0000 | ORAL_TABLET | Freq: Every day | ORAL | Status: DC
  Administered 2012-02-20 – 2012-02-24 (×5): 1 via ORAL
  Filled 2012-02-20 (×5): qty 1

## 2012-02-20 MED ORDER — VITAMIN B-1 100 MG PO TABS
100.0000 mg | ORAL_TABLET | Freq: Every day | ORAL | Status: DC
  Administered 2012-02-20 – 2012-02-24 (×5): 100 mg via ORAL
  Filled 2012-02-20 (×5): qty 1

## 2012-02-20 MED ORDER — PROPOFOL 10 MG/ML IV EMUL
5.0000 ug/kg/min | INTRAVENOUS | Status: DC
  Administered 2012-02-20: 25 ug/kg/min via INTRAVENOUS
  Filled 2012-02-20: qty 100

## 2012-02-20 MED ORDER — CLONAZEPAM 1 MG PO TABS
1.0000 mg | ORAL_TABLET | Freq: Four times a day (QID) | ORAL | Status: DC
  Administered 2012-02-20 – 2012-02-23 (×8): 1 mg via ORAL
  Filled 2012-02-20 (×9): qty 1

## 2012-02-20 MED ORDER — CLONAZEPAM 0.1 MG/ML ORAL SUSPENSION: 1.0000 mg | Freq: Four times a day (QID) | ORAL | Status: DC
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
## 2012-02-20 NOTE — Progress Notes (Signed)
Into room for morning assessment, pt refuses to wear oxygen sat probe, refusing care, requesting to go home.  Alert on propofol gtt, no s/s of hypoxia, sat probe on forehead intermittently picks up.  Patient refusing care and speaking harshly to staff.  Will continue to closely monitor and discuss delirium with MD at bedside rounds this morning.  Patient's girlfriend is at the bedside, per night shift report family has been updated as to patient status.

## 2012-02-20 NOTE — Progress Notes (Signed)
Nursing daily progress:  Mr. Gudino has slept most of the day.  No further outburst and or request to go home or leave the hospital.  He has remained calm and managed to take his p.o. Daily medications, for which he previously refused, late this afternoon.  Mr Gauss remains pleasant, precedex gtt continues as per md order for desired rass of -1.  Foley catheter removed this afternoon as patient became more coherent.  Mr Heiland no longer requires restraints and is oriented x4.  Will continue to monitor closely. Sitter remains at bedside.

## 2012-02-20 NOTE — Progress Notes (Signed)
Brief nursing progress:  Pt has been resting well in bed, continues to refuse care and request to leave the hospital.  Sitter at bedside, foley catheter to be removed as soon as patient becomes more less lethargic from sedatives. Attempting wean off propofol and onto precidex gtt.  Will continue to closely monitor.

## 2012-02-20 NOTE — Progress Notes (Signed)
Pt refuses foley/peri care at this time.

## 2012-02-20 NOTE — Anesthesia Postprocedure Evaluation (Signed)
  Anesthesia Post-op Note  Patient: Matthew Baker  Procedure(s) Performed: Procedure(s) (LRB): OPEN REDUCTION INTERNAL FIXATION (ORIF) PELVIC FRACTURE (Left)  Patient Location: PACU and SICU  Anesthesia Type: General  Level of Consciousness: awake  Airway and Oxygen Therapy: Patient Spontanous Breathing and Patient connected to nasal cannula oxygen  Post-op Pain: none  Post-op Assessment: Post-op Vital signs reviewed  Post-op Vital Signs: Reviewed and stable  Complications: No apparent anesthesia complications

## 2012-02-20 NOTE — Progress Notes (Signed)
UR complete 

## 2012-02-21 DIAGNOSIS — S01502A Unspecified open wound of oral cavity, initial encounter: Secondary | ICD-10-CM

## 2012-02-21 LAB — CULTURE, BAL-QUANTITATIVE W GRAM STAIN

## 2012-02-21 MED ORDER — OXYCODONE-ACETAMINOPHEN 5-325 MG PO TABS
1.0000 | ORAL_TABLET | ORAL | Status: DC | PRN
  Administered 2012-02-21 – 2012-02-24 (×11): 2 via ORAL
  Filled 2012-02-21 (×12): qty 2
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
## 2012-02-21 NOTE — Progress Notes (Signed)
Clinical Social Worker completed the psychosocial assessment which can be found in the shadow chart.  [address] [place], [place] [phone]

## 2012-02-21 NOTE — Progress Notes (Addendum)
Patient ID: Matthew Baker, male   DOB: 05/27/1991, 21 y.o.   MRN: [id number] 3 Days Post-Op  Subjective: Suprapubic pain from pelvic surgery  Objective: Vital signs in last 24 hours: Temp:  [97.6 F (36.4 C)-98.6 F (37 C)] 98.6 F (37 C) (03/15 0813) Pulse Rate:  [53-68] 55  (03/15 0700) Resp:  [23-34] 30  (03/15 0700) BP: (112-145)/(45-106) 135/86 mmHg (03/15 0700) SpO2:  [98 %-100 %] 100 % (03/15 0700) Weight:  [100.7 kg (222 lb 0.1 oz)] 100.7 kg (222 lb 0.1 oz) (03/15 0400) Last BM Date: 02/15/12  Intake/Output from previous day: 03/14 0701 - 03/15 0700 In: 2766.4 [I.V.:2506.4; IV Piggyback:260] Out: 1775 [Urine:1775] Intake/Output this shift:    General appearance: alert and cooperative Resp: clear to auscultation bilaterally Cardio: regular rate and rhythm GI: soft, tender suprapubic, +BS  Lab Results: CBC   Basename 02/20/12 0400 02/19/12 0500  WBC 23.2* 16.5*  HGB 11.2* 11.0*  HCT 33.8* 33.4*  PLT 225 183   BMET  Basename 02/20/12 0400 02/19/12 0500  NA 142 142  K 4.2 3.1*  CL 110 108  CO2 24 25  GLUCOSE 119* 87  BUN 14 14  CREATININE 0.94 1.13  CALCIUM 8.9 8.3*   PT/INR No results found for this basename: LABPROT:2,INR:2 in the last 72 hours ABG  Basename 02/19/12 1755  PHART 7.391  HCO3 21.6    Studies/Results: Dg Chest Port 1 View  02/19/2012  *RADIOLOGY REPORT*  Clinical Data: Status post bronchoscopy  PORTABLE CHEST - 1 VIEW  Comparison: 02/19/2012  Findings:  The endotracheal tube tip is above the carina.  The heart size appears normal.  Airspace consolidation and atelectasis in the right lower lobe is again noted and is unchanged from previous exam.  Left lung is well-aerated.  IMPRESSION:  1.  No change in aeration to the right lower lobe compared with previous exam. 2.  Stable position of ET tube.  Original Report Authenticated By: Rosealee Albee, M.D.    Anti-infectives: Anti-infectives     Start     Dose/Rate Route Frequency  Ordered Stop   02/19/12 1900   moxifloxacin (AVELOX) IVPB 400 mg        400 mg 250 mL/hr over 60 Minutes Intravenous Every 24 hours 02/19/12 1749     02/18/12 1800   ceFAZolin (ANCEF) IVPB 1 g/50 mL premix        1 g 100 mL/hr over 30 Minutes Intravenous 3 times per day 02/18/12 1638 02/19/12 1108   02/18/12 0600   ceFAZolin (ANCEF) IVPB 1 g/50 mL premix  Status:  Discontinued        1 g 100 mL/hr over 30 Minutes Intravenous On call to O.R. 02/17/12 1044 02/17/12 1144   02/18/12 0600   ceFAZolin (ANCEF) IVPB 2 g/50 mL premix        2 g 100 mL/hr over 30 Minutes Intravenous On call to O.R. 02/17/12 1144 02/18/12 1330          Assessment/Plan: s/p Procedure(s): OPEN REDUCTION INTERNAL FIXATION (ORIF) PELVIC FRACTURE PHBC PNA - Cx shows staph, continue Avelox pending sensitivities Pelvic Fx with diastasis and sacral Fx - S/P ORIF R patella dislocation - per ortho Facial abrasions and tongue lac FEN - advance diet Agitation - continue Klonopin, wean off precedex Pulmonary nodules - outpatient F/U VTE - Lovenox PT/OT I D/W patient's mother  LOS: 6 days    Violeta Gelinas, MD, MPH, FACS Pager: [phone]  02/21/2012

## 2012-02-22 LAB — BASIC METABOLIC PANEL
Calcium: 8.7 mg/dL (ref 8.4–10.5)
Creatinine, Ser: 0.86 mg/dL (ref 0.50–1.35)
GFR calc non Af Amer: >90 mL/min (ref >90)
Glucose, Bld: 82 mg/dL (ref 70–99)
Sodium: 138 mEq/L (ref 135–145)

## 2012-02-22 LAB — CBC
MCH: 29.2 pg (ref 26.0–34.0)
MCHC: 33.4 g/dL (ref 30.0–36.0)
MCV: 87.4 fL (ref 78.0–100.0)
Platelets: 330 10*3/uL (ref 150–400)

## 2012-02-22 MED ORDER — HYDROMORPHONE HCL PF 1 MG/ML IJ SOLN: 0.5000 mg | INTRAMUSCULAR | Status: DC | PRN

## 2012-02-22 MED ORDER — DEXAMETHASONE SODIUM PHOSPHATE 4 MG/ML IJ SOLN
4.0000 mg | INTRAMUSCULAR | Status: DC
Start: 2012-02-22 — End: 2012-02-23
  Administered 2012-02-22: 4 mg via INTRAVENOUS
  Filled 2012-02-22 (×2): qty 1

## 2012-02-22 MED ORDER — MAGIC MOUTHWASH
15.0000 mL | Freq: Four times a day (QID) | ORAL | Status: DC
  Administered 2012-02-22 – 2012-02-24 (×7): 15 mL via ORAL
  Filled 2012-02-22 (×14): qty 15

## 2012-02-22 MED ORDER — DOCUSATE SODIUM 100 MG PO CAPS
100.0000 mg | ORAL_CAPSULE | Freq: Two times a day (BID) | ORAL | Status: DC
  Administered 2012-02-22 – 2012-02-24 (×5): 100 mg via ORAL
  Filled 2012-02-22 (×6): qty 1

## 2012-02-22 MED ORDER — POLYETHYLENE GLYCOL 3350 17 G PO PACK
17.0000 g | PACK | Freq: Every day | ORAL | Status: DC
  Administered 2012-02-22 – 2012-02-24 (×3): 17 g via ORAL
  Filled 2012-02-22 (×3): qty 1

## 2012-02-22 MED ORDER — TRAMADOL HCL 50 MG PO TABS
100.0000 mg | ORAL_TABLET | Freq: Four times a day (QID) | ORAL | Status: DC
  Administered 2012-02-22 – 2012-02-24 (×9): 100 mg via ORAL
  Filled 2012-02-22: qty 1
  Filled 2012-02-22 (×4): qty 2
  Filled 2012-02-22: qty 1
  Filled 2012-02-22 (×2): qty 2
  Filled 2012-02-22 (×2): qty 1
  Filled 2012-02-22: qty 2

## 2012-02-22 MED ORDER — MOXIFLOXACIN HCL 400 MG PO TABS
400.0000 mg | ORAL_TABLET | Freq: Every day | ORAL | Status: DC
  Administered 2012-02-22 – 2012-02-23 (×2): 400 mg via ORAL
  Filled 2012-02-22 (×3): qty 1

## 2012-02-22 NOTE — Progress Notes (Signed)
Patient was transferred to [address] from [address].  Report was given prior to transport to receiving RN.  Patient's personal belongings, medications, and chart were all sent with patient.  Keitha Butte, RN

## 2012-02-22 NOTE — Progress Notes (Signed)
Patient ID: Matthew Baker, male   DOB: 1991/01/07, 21 y.o.   MRN: [id number]   LOS: 7 days   Subjective: C/o lower abdominal/pelvic pain. No N/V.  Objective: Vital signs in last 24 hours: Temp:  [98.6 F (37 C)-99.4 F (37.4 C)] 99.1 F (37.3 C) (03/16 0729) Pulse Rate:  [63-133] 96  (03/16 0828) Resp:  [18-40] 29  (03/16 0700) BP: (112-138)/(44-77) 137/68 mmHg (03/16 0700) SpO2:  [94 %-100 %] 99 % (03/16 0700) Weight:  [100.3 kg (221 lb 1.9 oz)] 100.3 kg (221 lb 1.9 oz) (03/16 0600) Last BM Date: 02/15/12  Lab Results:  CBC  Basename 02/22/12 0450 02/20/12 0400  WBC 19.9* 23.2*  HGB 12.7* 11.2*  HCT 38.0* 33.8*  PLT 330 225   BMET  Basename 02/22/12 0450 02/20/12 0400  NA 138 142  K 3.4* 4.2  CL 102 110  CO2 25 24  GLUCOSE 82 119*  BUN 17 14  CREATININE 0.86 0.94  CALCIUM 8.7 8.9    General appearance: alert and no distress Throat: 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 Begin steroid wean. ABL anemia -- Stable MSSA PNA - On Avelox D4/10. Change to PO. Afebrile, WBC improving. Pulmonary nodules - outpatient F/U  FEN - Add ultram for better pain control VTE - Lovenox  Dispo -- To floor    Freeman Caldron, PA-C Pager: [phone] General Trauma PA Pager: [phone]   02/22/2012
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
## 2012-02-22 NOTE — Progress Notes (Signed)
I have seen and examined the patient and agree with the assessment and plans. Transfer to floor  Ilay Capshaw A. Raniyah Curenton  MD, FACS 

## 2012-02-22 NOTE — Progress Notes (Signed)
Physical Therapy Treatment Patient Details Name: Matthew Baker MRN: [id number] DOB: 02-27-1991 Today's Date: 02/22/2012  PT Assessment/Plan  PT - Assessment/Plan Comments on Treatment Session: Pt with L pelvic ORIF continues to improve with gait and transfers. Encouraged ambulation with nursing tomorrow. Continue to need RW at this time for balance but anticipate progression to crutches. Pt encouraged to continue HEP as pain allows.  PT Plan: Discharge plan remains appropriate PT Goals  Acute Rehab PT Goals PT Goal: Sit to Stand - Progress: Progressing toward goal PT Goal: Stand to Sit - Progress: Progressing toward goal PT Goal: Ambulate - Progress: Progressing toward goal PT Goal: Up/Down Stairs - Progress: Progressing toward goal  PT Treatment Precautions/Restrictions  Precautions Precautions: Fall Restrictions Weight Bearing Restrictions: Yes LLE Weight Bearing: Non weight bearing Other Position/Activity Restrictions: no Mobility (including Balance) Bed Mobility Bed Mobility: No Transfers Sit to Stand: 4: Min assist;From chair/3-in-1;With armrests Sit to Stand Details (indicate cue type and reason): cueing for hand and LLE placement Stand to Sit: 4: Min assist;To chair/3-in-1;With armrests Stand to Sit Details: assist to control descent and cueing to sequence Ambulation/Gait Ambulation/Gait Assistance: 4: Min assist Ambulation/Gait Assistance Details (indicate cue type and reason): minguard with additional assist for balance when pt has to cough as he doubles over due to pelvic discomfort Ambulation Distance (Feet): 80 Feet Assistive device: Rolling walker Gait Pattern: Step-to pattern Stairs: No  Posture/Postural Control Posture/Postural Control: No significant limitations Exercise  General Exercises - Lower Extremity Long Arc Quad: AROM;Right;10 reps;Left;15 reps;Seated Hip Flexion/Marching: AROM;Right;Left;10 reps;Other reps (comment);Seated (15reps on RLE) End of  Session PT - End of Session Equipment Utilized During Treatment: Gait belt Activity Tolerance: Patient limited by pain Patient left: in chair;with call bell in reach Nurse Communication: Mobility status for transfers;Mobility status for ambulation General Behavior During Session: [facility] for tasks performed Cognition: [facility] for tasks performed  Delorse Lek 02/22/2012, 8:32 AM Delaney Meigs, PT [phone]

## 2012-02-23 LAB — CBC
MCH: 29.3 pg (ref 26.0–34.0)
MCV: 86.7 fL (ref 78.0–100.0)
Platelets: 336 10*3/uL (ref 150–400)
RBC: 4.37 MIL/uL (ref 4.22–5.81)
RDW: 13.5 % (ref 11.5–15.5)
WBC: 23.5 10*3/uL — ABNORMAL HIGH (ref 4.0–10.5)

## 2012-02-23 MED ORDER — DEXAMETHASONE SODIUM PHOSPHATE 4 MG/ML IJ SOLN
2.0000 mg | INTRAMUSCULAR | Status: AC
  Administered 2012-02-23: 2 mg via INTRAVENOUS
  Filled 2012-02-23: qty 0.5

## 2012-02-23 MED ORDER — CLONAZEPAM 0.5 MG PO TABS
0.5000 mg | ORAL_TABLET | Freq: Four times a day (QID) | ORAL | Status: DC
  Administered 2012-02-23 – 2012-02-24 (×5): 0.5 mg via ORAL
  Filled 2012-02-23 (×5): qty 1
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
## 2012-02-23 NOTE — Progress Notes (Signed)
Patient ID: Matthew Baker, male   DOB: 1991/03/21, 21 y.o.   MRN: [id number]   LOS: 8 days   Subjective: Pain better controlled with addition of tramadol. No other c/o.  Objective: Vital signs in last 24 hours: Temp:  [97.2 F (36.2 C)-99.3 F (37.4 C)] 97.2 F (36.2 C) (03/17 4098) Pulse Rate:  [82-95] 83  (03/17 0638) Resp:  [16-30] 16  (03/17 0638) BP: (120-163)/(63-88) 163/88 mmHg (03/17 0638) SpO2:  [97 %-100 %] 100 % (03/17 0638) Last BM Date: 02/15/12  Lab Results:  CBC  Basename 02/23/12 0525 02/22/12 0450  WBC 23.5* 19.9*  HGB 12.8* 12.7*  HCT 37.9* 38.0*  PLT 336 330    General appearance: alert and no distress Resp: clear to auscultation bilaterally Cardio: regular rate and rhythm GI: normal findings: bowel sounds normal and soft, non-tender Pulses: 2+ and symmetric  Assessment/Plan: PHBC  Pelvic Fxs with diastasis and sacral Fx s/p ORIF  Bilateral soft tissue knee injuries -- NWB LLE, WBAT RLE  Tongue laceration s/p repair -- Magic mouthwash. Steroid wean.  ABL anemia -- Stable  MSSA PNA - On Avelox D5/10. Afebrile. WBC up today, ?steroid effect. Pulmonary nodules - outpatient F/U  FEN - Add ultram for better pain control  VTE - Lovenox  Dispo -- Likely home tomorrow with HHPT    Freeman Caldron, PA-C Pager: [phone] General Trauma PA Pager: [phone]   02/23/2012

## 2012-02-23 NOTE — Progress Notes (Signed)
Carynn Felling, MD, MPH, FACS Pager: [phone]  

## 2012-02-24 LAB — CBC
HCT: 38.4 % — ABNORMAL LOW (ref 39.0–52.0)
MCHC: 34.4 g/dL (ref 30.0–36.0)
Platelets: 383 10*3/uL (ref 150–400)
RDW: 13.5 % (ref 11.5–15.5)
WBC: 20.8 10*3/uL — ABNORMAL HIGH (ref 4.0–10.5)

## 2012-02-24 LAB — PROTIME-INR
INR: 1.04 (ref 0.00–1.49)
Prothrombin Time: 13.8 s (ref 11.6–15.2)

## 2012-02-24 MED ORDER — ENOXAPARIN SODIUM 30 MG/0.3ML ~~LOC~~ SOLN: 30.0000 mg | Freq: Two times a day (BID) | SUBCUTANEOUS | Status: DC

## 2012-02-24 MED ORDER — OXYCODONE-ACETAMINOPHEN 5-325 MG PO TABS: 1.0000 | ORAL_TABLET | ORAL | Status: AC | PRN

## 2012-02-24 MED ORDER — MOXIFLOXACIN HCL 400 MG PO TABS: 400.0000 mg | ORAL_TABLET | Freq: Every day | ORAL | Status: AC

## 2012-02-24 MED ORDER — WARFARIN - PHYSICIAN DOSING INPATIENT: Freq: Every day | Status: DC

## 2012-02-24 MED ORDER — WARFARIN SODIUM 10 MG PO TABS
10.0000 mg | ORAL_TABLET | Freq: Once | ORAL | Status: DC
  Filled 2012-02-24: qty 1

## 2012-02-24 MED ORDER — WARFARIN SODIUM 5 MG PO TABS: 5.0000 mg | ORAL_TABLET | Freq: Every day | ORAL | Status: DC

## 2012-02-24 MED ORDER — TRAMADOL HCL 50 MG PO TABS: 100.0000 mg | ORAL_TABLET | Freq: Four times a day (QID) | ORAL | Status: AC
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
## 2012-02-24 NOTE — Discharge Instructions (Signed)
Do not put weight on right leg. No driving.  May wash wounds in shower with soap and water. Do not soak. Keep abrasions moist with antibiotic ointment until healed.

## 2012-02-24 NOTE — Progress Notes (Signed)
Occupational Therapy Treatment Patient Details Name: Cadyn Fann MRN: [id number] DOB: 20-Apr-1991 Today's Date: 02/24/2012 9:46-10:02  1sc OT Assessment/Plan OT Assessment/Plan Comments on Treatment Session: Pt doing well overall supervision for simulated toilet transfers and shower transfers. Will have assistance post discharge PRN.  Will need 3:1 for use at home as well. OT Plan: Discharge plan remains appropriate Follow Up Recommendations: No OT follow up Equipment Recommended: 3 in 1 bedside comode OT Goals ADL Goals ADL Goal: Toilet Transfer - Progress: Met ADL Goal: Tub/Shower Transfer - Progress: Met  OT Treatment Precautions/Restrictions  Precautions Precautions: Fall Required Braces or Orthoses: No Restrictions Weight Bearing Restrictions: Yes LLE Weight Bearing: Non weight bearing   ADL ADL Toilet Transfer: Supervision/safety;Simulated Toilet Transfer Method: Stand pivot Acupuncturist: Animator Transfer: Engineer, manufacturing Details (indicate cue type and reason): Step backwards over the edge of a simulated walk-in shower. Tub/Shower Transfer Method: Science writer:  (3:1) Equipment Used: Rolling walker Ambulation Related to ADLs: Pt able to ambulate in the room with a rolling walker, maintaining NWBin on the LLE with min assist. ADL Comments: Pt doing well anticipate discharge today.  Encouraged him to use the 3:1 in the shower and over the toilet.  Also discussed purchaing a hand held shower head. Mobility  Bed Mobility Bed Mobility: Yes Supine to Sit: 6: Modified independent (Device/Increase time) Sitting - Scoot to Edge of Bed: 6: Modified independent (Device/Increase time) Sit to Supine: 6: Modified independent (Device/Increase time) Transfers Transfers: Yes Sit to Stand: 5: Supervision Sit to Stand Details (indicate cue type and reason): Verbal cues for hand  placement. Stand to Sit: 5: Supervision Stand to Sit Details: Assist to slow descent to bed with cues for slow sequence.  End of Session OT - End of Session Equipment Utilized During Treatment: Other (comment) (rolling walker) Activity Tolerance: Patient tolerated treatment well Patient left: in bed;with call bell in reach General Behavior During Session: [facility] for tasks performed Cognition: [facility] for tasks performed  Neema Fluegge OTR/L 02/24/2012, 11:37 AM Pager number [phone]

## 2012-02-24 NOTE — Progress Notes (Signed)
Will need to go home on Avelox treatment for Staph PNA.  This patient has been seen and I agree with the findings and treatment plan.  Marta Lamas. Gae Bon, MD, FACS [phone] (pager) [phone] (direct pager) Trauma Surgeon

## 2012-02-24 NOTE — Progress Notes (Signed)
02/24/12 16:00 Call from patient's mother who states that Medicaid declined lovenox coverage due to the type of Medicaid patient has. Call to [facility] and discussed earlier conversation with Gregary Signs at [facility] who stated Lovenox would be covered. Kha in the pharmacy is going to provide patient with 2 syringes, Assistant CM director approved for prescription fill of 10 syringes from [employer].  Mom aware will pick up tomorrow, she has contacted DSS and medicaid coverage should be in place. Numerous calls to main pharmacy spoke with Selena Batten and Laney Potash who are expecting prescription.  Jim Like RN BSN CCM

## 2012-02-24 NOTE — Discharge Summary (Signed)
The patient is ready for discharge.  This patient has been seen and I agree with the findings and treatment plan.  Marta Lamas. Gae Bon, MD, FACS [phone] (pager) [phone] (direct pager) Trauma Surgeon
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
## 2012-02-24 NOTE — Progress Notes (Signed)
02/24/12 MD has ordered home health PT, wheelchair and walker. In to see patient and mom, per mom patient has medicaid.  Call to [facility] confirmed Medicaid prescription coverage.  Home health and DME referral called to Roque Cash with [facility]. Jim Like RN BSN CCM

## 2012-02-24 NOTE — Progress Notes (Signed)
Clinical Social Worker has completed SBIRT with patient at bedside.  CSW spoke with patient and patient mother at bedside.  Patient was agreeable for his mother to remain present at the time of SBIRT completion.  Patient and patient mother agree that patient seldomly drinks alcohol.  Patient aware of negative effects for drinking alcohol and taking medications.  Patient mother plans to continue to assist patient once discharged home.    Patient and patient mother are prepared to discharge home today.  Clinical Social Worker will sign off for now as social work intervention is no longer needed. Please consult [place] again if new need arises.  [address] [place], [place] [phone]

## 2012-02-25 ENCOUNTER — Telehealth (INDEPENDENT_AMBULATORY_CARE_PROVIDER_SITE_OTHER): Payer: Self-pay | Admitting: General Surgery

## 2012-02-25 NOTE — Telephone Encounter (Signed)
Physical therapist with [facility] calling to request a wheel chair order for Matthew Baker, to be used at home and for long distances travelled.  Phone: [phone] or FAX [phone]. Thanks.

## 2012-02-28 ENCOUNTER — Encounter (HOSPITAL_COMMUNITY): Payer: Self-pay | Admitting: Orthopedic Surgery

## 2014-12-22 ENCOUNTER — Encounter (HOSPITAL_COMMUNITY): Payer: Self-pay | Admitting: General Surgery

## 2018-05-02 ENCOUNTER — Other Ambulatory Visit: Payer: Self-pay

## 2018-05-02 ENCOUNTER — Encounter (HOSPITAL_COMMUNITY): Payer: Self-pay | Admitting: Emergency Medicine

## 2018-05-02 ENCOUNTER — Emergency Department (HOSPITAL_COMMUNITY): Payer: Self-pay

## 2018-05-02 ENCOUNTER — Emergency Department (HOSPITAL_COMMUNITY)
Admission: EM | Admit: 2018-05-02 | Discharge: 2018-05-02 | Disposition: A | Discharge status: Home / Self Care | Payer: Self-pay | Attending: Emergency Medicine | Admitting: Emergency Medicine

## 2018-05-02 DIAGNOSIS — R390 Extravasation of urine: Secondary | ICD-10-CM

## 2018-05-02 DIAGNOSIS — N201 Calculus of ureter: Secondary | ICD-10-CM | POA: Insufficient documentation

## 2018-05-02 DIAGNOSIS — Z87891 Personal history of nicotine dependence: Secondary | ICD-10-CM | POA: Insufficient documentation

## 2018-05-02 LAB — CBC
HEMATOCRIT: 50.9 % (ref 39.0–52.0)
Hemoglobin: 16.6 g/dL (ref 13.0–17.0)
MCH: 30 pg (ref 26.0–34.0)
MCHC: 32.6 g/dL (ref 30.0–36.0)
MCV: 92 fL (ref 78.0–100.0)
Platelets: 236 10*3/uL (ref 150–400)
RBC: 5.53 MIL/uL (ref 4.22–5.81)
RDW: 13.9 % (ref 11.5–15.5)
WBC: 14.3 10*3/uL — AB (ref 4.0–10.5)

## 2018-05-02 LAB — COMPREHENSIVE METABOLIC PANEL
ALT: 16 U/L — ABNORMAL LOW (ref 17–63)
ANION GAP: 13 (ref 5–15)
AST: 28 U/L (ref 15–41)
Albumin: 4.4 g/dL (ref 3.5–5.0)
Alkaline Phosphatase: 50 U/L (ref 38–126)
BUN: 22 mg/dL — ABNORMAL HIGH (ref 6–20)
CHLORIDE: 105 mmol/L (ref 101–111)
CO2: 22 mmol/L (ref 22–32)
Calcium: 9.2 mg/dL (ref 8.9–10.3)
Creatinine, Ser: 1.19 mg/dL (ref 0.61–1.24)
GFR calc non Af Amer: >60 mL/min (ref >60)
Glucose, Bld: 90 mg/dL (ref 65–99)
Potassium: 3.7 mmol/L (ref 3.5–5.1)
SODIUM: 140 mmol/L (ref 135–145)
Total Bilirubin: 1 mg/dL (ref 0.3–1.2)
Total Protein: 7.3 g/dL (ref 6.5–8.1)

## 2018-05-02 LAB — URINALYSIS, ROUTINE W REFLEX MICROSCOPIC
BACTERIA UA: NONE SEEN
Bilirubin Urine: NEGATIVE
Glucose, UA: NEGATIVE mg/dL
KETONES UR: 80 mg/dL — AB
Leukocytes, UA: NEGATIVE
Nitrite: NEGATIVE
PH: 5 (ref 5.0–8.0)
PROTEIN: NEGATIVE mg/dL
Specific Gravity, Urine: >1.046 — ABNORMAL HIGH (ref 1.005–1.030)

## 2018-05-02 LAB — LIPASE, BLOOD: LIPASE: 27 U/L (ref 11–51)

## 2018-05-02 MED ORDER — HYDROMORPHONE HCL 1 MG/ML IJ SOLN
1.0000 mg | Freq: Once | INTRAMUSCULAR | Status: AC
  Administered 2018-05-02: 1 mg via INTRAVENOUS
  Filled 2018-05-02: qty 1

## 2018-05-02 MED ORDER — KETOROLAC TROMETHAMINE 30 MG/ML IJ SOLN
30.0000 mg | Freq: Once | INTRAMUSCULAR | Status: AC
  Administered 2018-05-02: 30 mg via INTRAVENOUS
  Filled 2018-05-02: qty 1

## 2018-05-02 MED ORDER — FENTANYL CITRATE (PF) 100 MCG/2ML IJ SOLN
100.0000 ug | Freq: Once | INTRAMUSCULAR | Status: AC
Start: 2018-05-02 — End: 2018-05-02
  Administered 2018-05-02: 100 ug via INTRAVENOUS
  Filled 2018-05-02: qty 2

## 2018-05-02 MED ORDER — IOPAMIDOL (ISOVUE-300) INJECTION 61%
INTRAVENOUS | Status: AC
  Filled 2018-05-02: qty 100

## 2018-05-02 MED ORDER — TAMSULOSIN HCL 0.4 MG PO CAPS
0.4000 mg | ORAL_CAPSULE | Freq: Once | ORAL | Status: AC
  Administered 2018-05-02: 0.4 mg via ORAL
  Filled 2018-05-02: qty 1

## 2018-05-02 MED ORDER — IOPAMIDOL (ISOVUE-300) INJECTION 61%
100.0000 mL | Freq: Once | INTRAVENOUS | Status: AC | PRN
  Administered 2018-05-02: 100 mL via INTRAVENOUS

## 2018-05-02 MED ORDER — FENTANYL CITRATE (PF) 100 MCG/2ML IJ SOLN
100.0000 ug | Freq: Once | INTRAMUSCULAR | Status: AC
  Administered 2018-05-02: 100 ug via INTRAVENOUS
  Filled 2018-05-02: qty 2

## 2018-05-02 MED ORDER — ONDANSETRON 8 MG PO TBDP: 8.0000 mg | ORAL_TABLET | Freq: Three times a day (TID) | ORAL | 0 refills | Status: AC | PRN

## 2018-05-02 MED ORDER — TAMSULOSIN HCL 0.4 MG PO CAPS: ORAL_CAPSULE | ORAL | 0 refills | Status: AC

## 2018-05-02 MED ORDER — ONDANSETRON HCL 4 MG/2ML IJ SOLN
4.0000 mg | Freq: Once | INTRAMUSCULAR | Status: AC
  Administered 2018-05-02: 4 mg via INTRAVENOUS
  Filled 2018-05-02: qty 2

## 2018-05-02 MED ORDER — FENTANYL CITRATE (PF) 100 MCG/2ML IJ SOLN
50.0000 ug | INTRAMUSCULAR | Status: DC | PRN
  Administered 2018-05-02: 50 ug via INTRAVENOUS

## 2018-05-02 MED ORDER — HYDROMORPHONE HCL 1 MG/ML IJ SOLN: 1.0000 mg | Freq: Once | INTRAMUSCULAR | Status: DC

## 2018-05-02 MED ORDER — HYDROMORPHONE HCL 4 MG PO TABS: 4.0000 mg | ORAL_TABLET | ORAL | 0 refills | Status: AC | PRN

## 2018-05-02 MED ORDER — FENTANYL CITRATE (PF) 100 MCG/2ML IJ SOLN
INTRAMUSCULAR | Status: AC
  Filled 2018-05-02: qty 2
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
## 2018-05-02 NOTE — Discharge Instructions (Addendum)
If you develop a fever or your pain is not managed adequately with home medications please return to the [facility] emergency department.You can also take ibuprofen as directed for mild pain
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
## 2018-05-02 NOTE — ED Provider Notes (Signed)
8:25 AM patient reports his pain is 10 over 10. IV Toradol as ordered. He is alert Glasgow Coma Score 15 appears moderately uncomfortable.  8:55 AM pain is improved after treatment with intravenous Toradol. Requesting additional pain medicine. IV Dilaudid ordered.  9:45 AM patient asymptomatic. Not lightheaded on standing. Feels ready to go home.blood pressure 132/82 Dr. Read Drivers wrote prescriptions for Zofran, Flomax, hydromorphone. He can also take ibuprofen.referral [facility] Controlled Substance reporting System queried      Doug Sou, MD 05/02/18 [phone]

## 2018-07-16 IMAGING — CT CT ABD-PELV W/ CM
2 of 5 series · 15 of 46 positions shown, 17 images · IV contrast (iopamidol)
Comparison: None.

CLINICAL DATA: Abdominopelvic pain after intercourse last night.
Worse on the left. Nausea and vomiting. Elevated white blood cell
count.

EXAM:
CT ABDOMEN AND PELVIS WITH CONTRAST
TECHNIQUE: Multidetector CT imaging of the abdomen and pelvis was performed
using the standard protocol following bolus administration of
intravenous contrast.
CONTRAST:  100mL 2L2P6M-ZLL IOPAMIDOL (2L2P6M-ZLL) INJECTION 61%

[Series 2: axial st · axial · 0.79mm/px · z∈[+1198,+1588]mm · 12 of 92 slices shown, 14 images]
[im 7/92  soft-tissue]
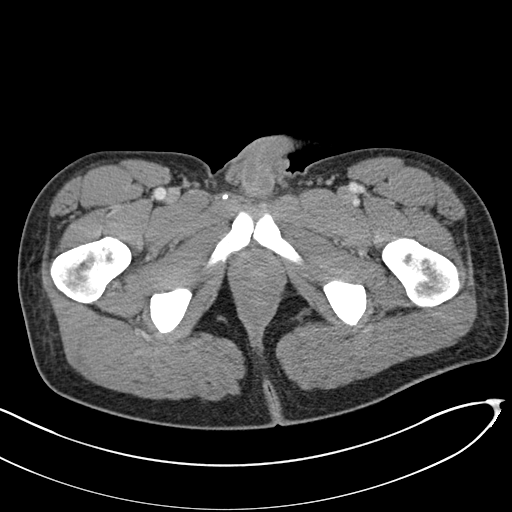
[im 7/92  bone]
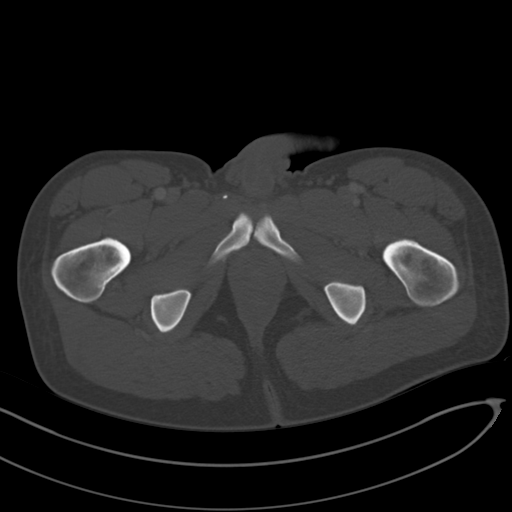
[im 13/92  soft-tissue]
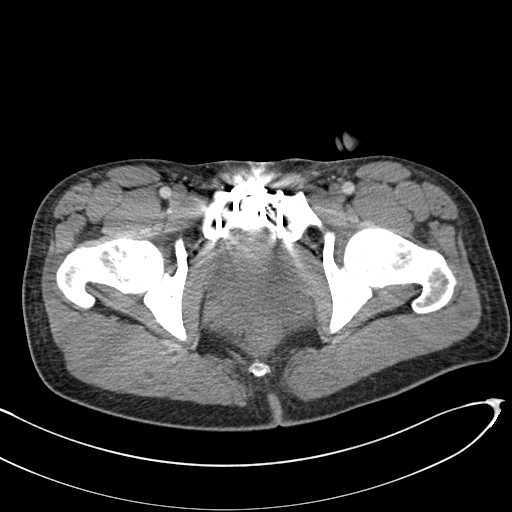
[im 19/92  soft-tissue]
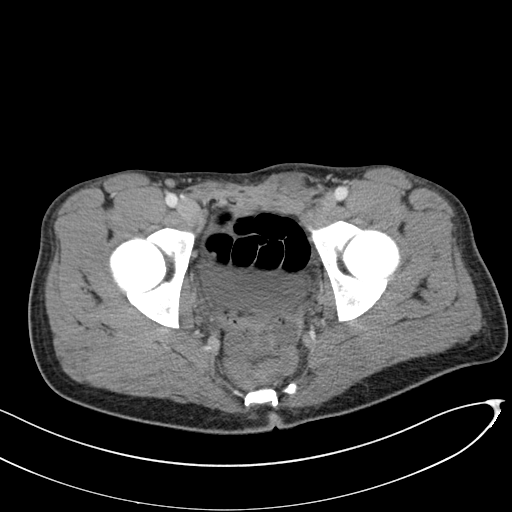
[im 31/92  soft-tissue]
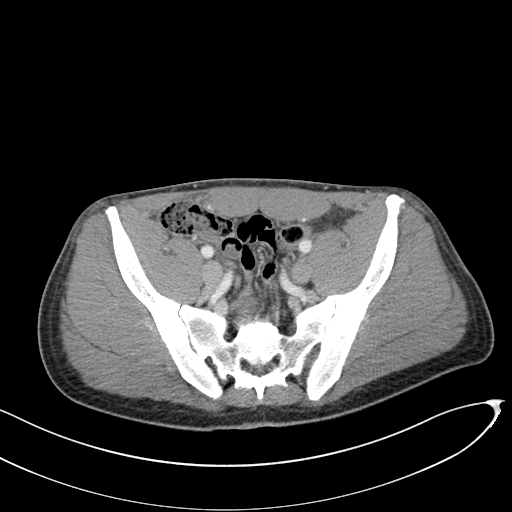
[im 37/92  soft-tissue]
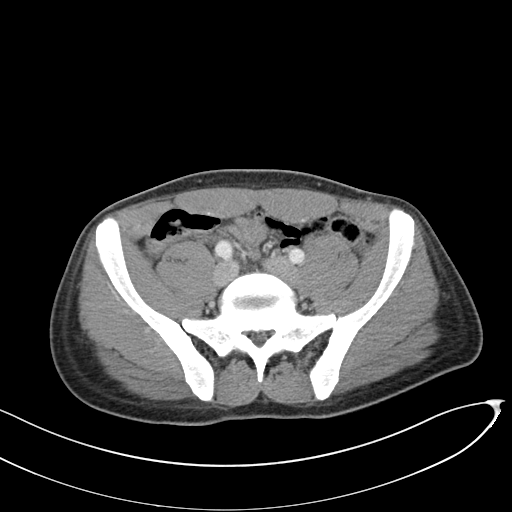
[im 43/92  soft-tissue]
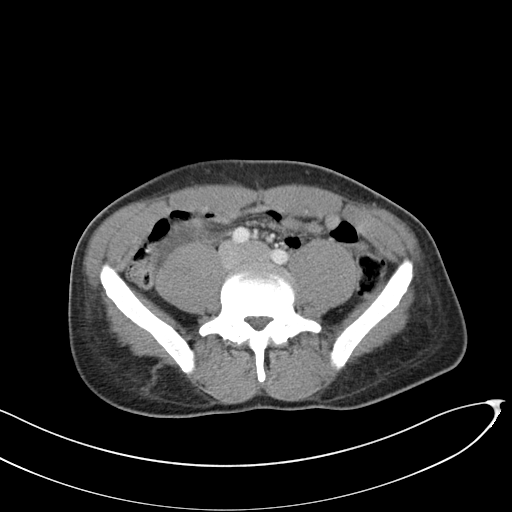
[im 49/92  soft-tissue]
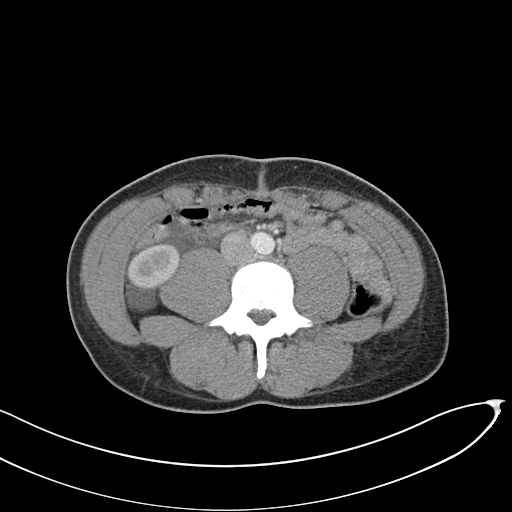
[im 55/92  soft-tissue]
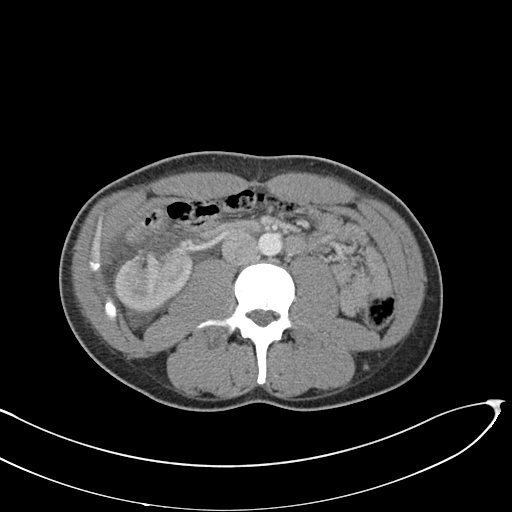
[im 61/92  soft-tissue]
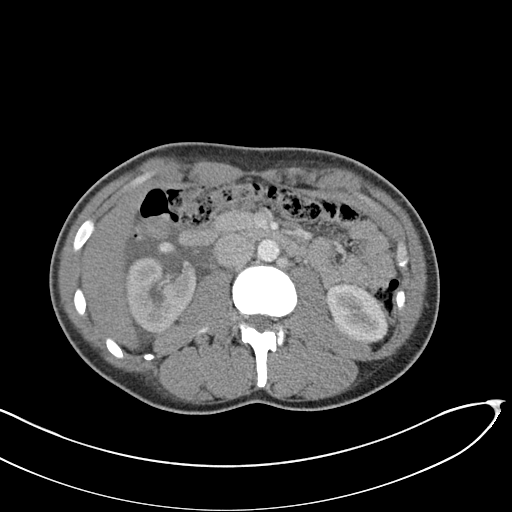
[im 61/92  bone]
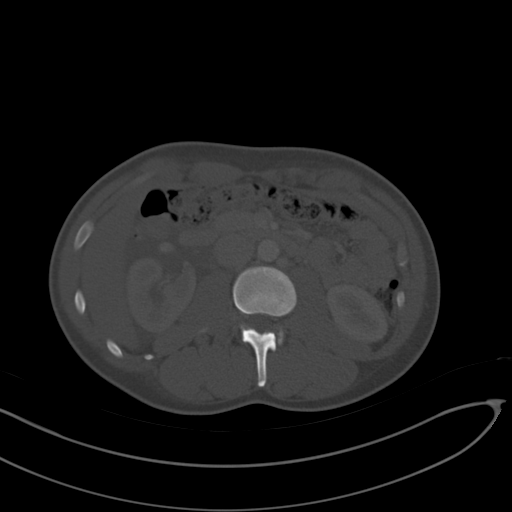
[im 73/92  soft-tissue]
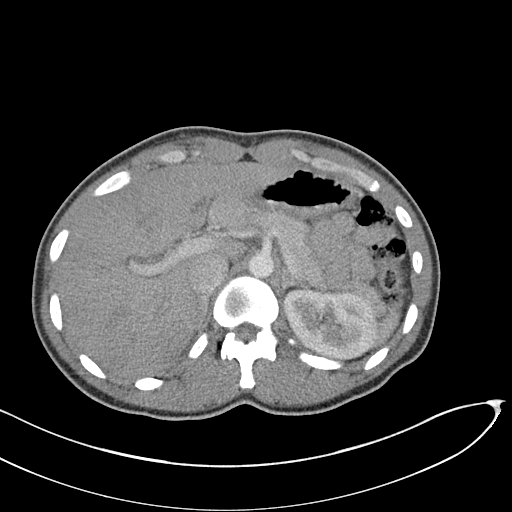
[im 79/92  soft-tissue]
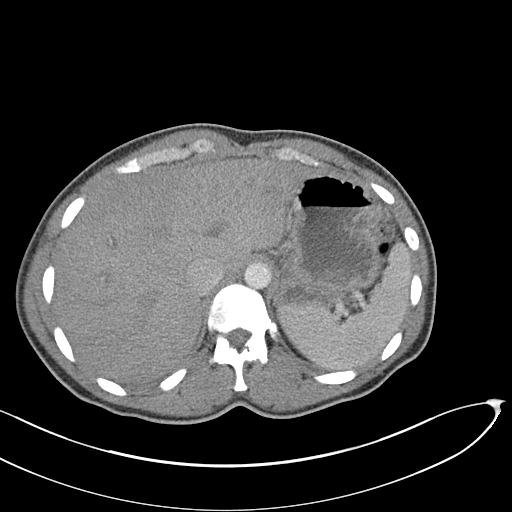
[im 85/92  soft-tissue]
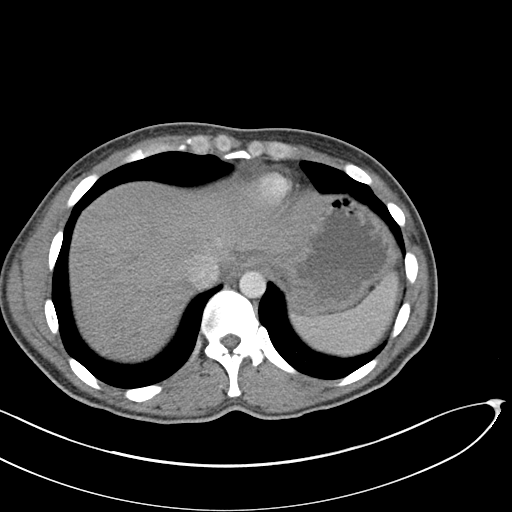

[Series 5: coronal st · coronal · 0.68mm/px · 3 of 78 slices shown]
[im 26/78  soft-tissue]
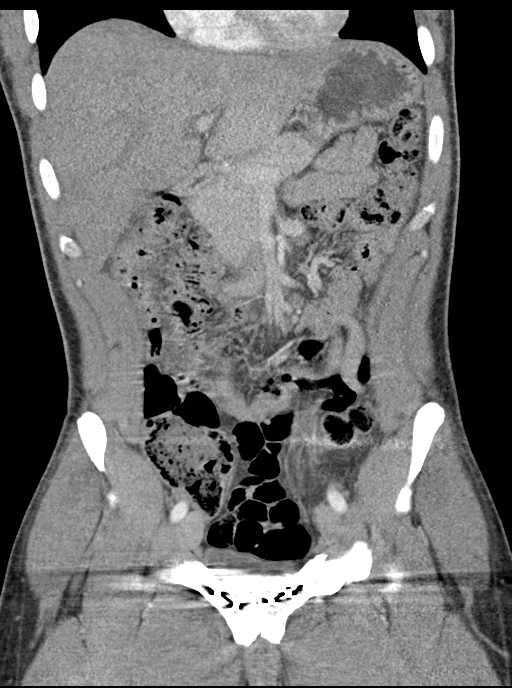
[im 35/78  soft-tissue]
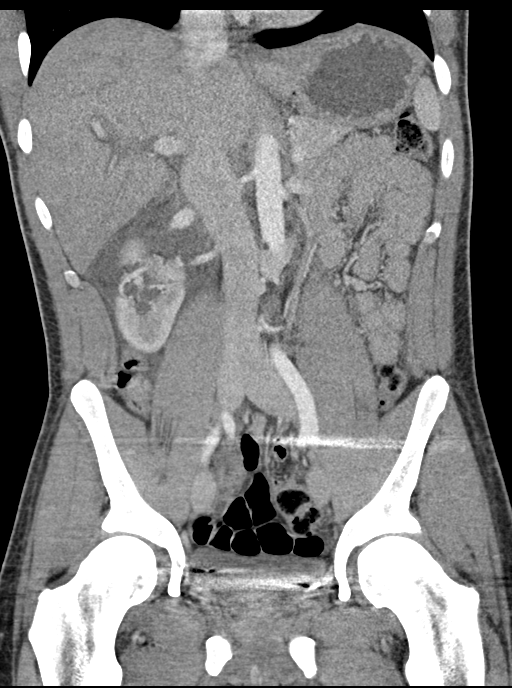
[im 43/78  soft-tissue]
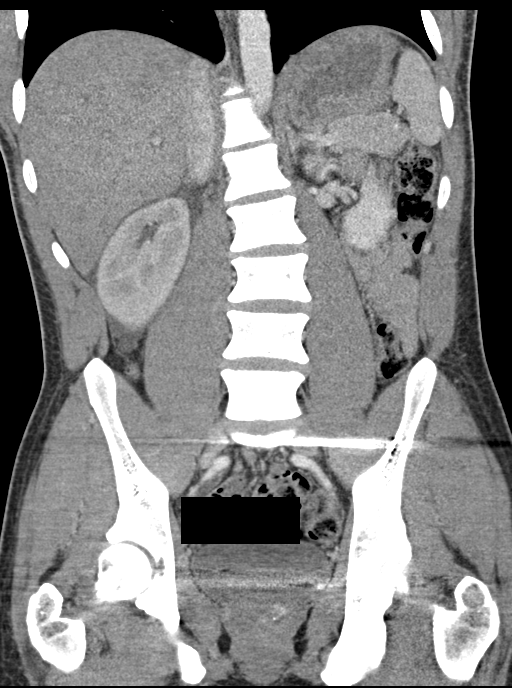

[15 of 46 positions shown; findings below may reference images not displayed]

FINDINGS: Lower chest: Minimal subpleural nodularity in the right lower lobe
is likely due to a subpleural lymph node or an area of scarring.
Normal heart size without pericardial or pleural effusion.

Hepatobiliary: Normal liver. Normal gallbladder, without biliary
ductal dilatation.

Pancreas: Normal, without mass or ductal dilatation.

Spleen: Normal in size, without focal abnormality.

Adrenals/Urinary Tract: Normal adrenal glands. Normal left kidney.
There is mild right-sided hydronephrosis with moderate to marked
right perinephric fluid. Delayed contrast excretion from the right
kidney. Mild right hydroureter. The ureter is difficult to follow
distally. However, a punctate stone is identified at the right
ureterovesicular junction on image 78/2 and coronal image 49.

Stomach/Bowel: Normal stomach, without wall thickening. Normal
colon. Normal small bowel.

Vascular/Lymphatic: Normal caliber of the aorta and branch vessels.
Circumaortic left renal vein. No abdominopelvic adenopathy.

Reproductive: Degraded evaluation of the pelvis, secondary to beam
hardening artifact from pubic symphysis fixation. Grossly normal
prostate.

Other: No significant free fluid.  Right-sided gynecomastia is mild.

Musculoskeletal: Pubic symphyses fixation. Left sacroiliac joint
fixation.
IMPRESSION: 1. Punctate stone at the right ureterovesicular junction with mild
secondary hydroureteronephrosis and delayed contrast excretion from
the right kidney. Perinephric fluid, most consistent with forniceal
rupture.
2. Degraded evaluation of the pelvis, secondary to beam hardening
artifact from pubic symphysis fixation.
